# Patient Record
Sex: Female | Born: 1960
Health system: Southern US, Community
[De-identification: ages and names within clinical notes are randomized; demographics above are authoritative.]

## PROBLEM LIST (undated history)

## (undated) DIAGNOSIS — N951 Menopausal and female climacteric states: Secondary | ICD-10-CM

## (undated) DIAGNOSIS — F419 Anxiety disorder, unspecified: Secondary | ICD-10-CM

## (undated) HISTORY — PX: CERVIX SURGERY: SHX593

## (undated) HISTORY — PX: DILATION AND CURETTAGE OF UTERUS: SHX78

## (undated) HISTORY — DX: Anxiety disorder, unspecified: F41.9

## (undated) HISTORY — PX: COLONOSCOPY: SHX174

---

## 2002-10-17 HISTORY — PX: BREAST CYST ASPIRATION: SHX578

## 2010-10-05 ENCOUNTER — Ambulatory Visit: Payer: Self-pay | Admitting: Family Medicine

## 2012-03-07 ENCOUNTER — Ambulatory Visit: Payer: Self-pay | Admitting: Family Medicine

## 2013-03-14 ENCOUNTER — Ambulatory Visit: Payer: Self-pay | Admitting: Family Medicine

## 2014-03-06 LAB — CBC AND DIFFERENTIAL
HEMATOCRIT: 42 % (ref 36–46)
HEMOGLOBIN: 13.9 g/dL (ref 12.0–16.0)
NEUTROS ABS: 60 /uL
Platelets: 252 10*3/uL (ref 150–399)
WBC: 6.3 10^3/mL

## 2014-03-06 LAB — HM MAMMOGRAPHY: HM Mammogram: NEGATIVE

## 2014-03-06 LAB — HEPATIC FUNCTION PANEL
ALK PHOS: 92 U/L (ref 25–125)
ALT: 14 U/L (ref 7–35)
AST: 21 U/L (ref 13–35)
BILIRUBIN, TOTAL: 0.5 mg/dL

## 2014-03-06 LAB — LIPID PANEL
CHOLESTEROL: 269 mg/dL — AB (ref 0–200)
HDL: 53 mg/dL (ref 35–70)
LDL Cholesterol: 177 mg/dL
LDL/HDL RATIO: 3.3
TRIGLYCERIDES: 194 mg/dL — AB (ref 40–160)

## 2014-03-06 LAB — BASIC METABOLIC PANEL
BUN: 13 mg/dL (ref 4–21)
Creatinine: 0.7 mg/dL (ref 0.5–1.1)
GLUCOSE: 91 mg/dL
POTASSIUM: 4.7 mmol/L (ref 3.4–5.3)
SODIUM: 142 mmol/L (ref 137–147)

## 2014-03-06 LAB — TSH: TSH: 1.66 u[IU]/mL (ref 0.41–5.90)

## 2014-03-06 LAB — HM PAP SMEAR: HM Pap smear: NEGATIVE

## 2014-03-10 LAB — HM COLONOSCOPY

## 2014-04-09 ENCOUNTER — Ambulatory Visit: Payer: Self-pay | Admitting: Family Medicine

## 2014-05-06 ENCOUNTER — Ambulatory Visit: Payer: Self-pay | Admitting: Family Medicine

## 2015-02-11 DIAGNOSIS — N200 Calculus of kidney: Secondary | ICD-10-CM | POA: Insufficient documentation

## 2015-02-11 DIAGNOSIS — G43109 Migraine with aura, not intractable, without status migrainosus: Secondary | ICD-10-CM | POA: Insufficient documentation

## 2015-02-11 DIAGNOSIS — M25559 Pain in unspecified hip: Secondary | ICD-10-CM | POA: Insufficient documentation

## 2015-02-11 DIAGNOSIS — N951 Menopausal and female climacteric states: Secondary | ICD-10-CM | POA: Insufficient documentation

## 2015-02-11 DIAGNOSIS — E559 Vitamin D deficiency, unspecified: Secondary | ICD-10-CM | POA: Insufficient documentation

## 2015-02-11 DIAGNOSIS — M549 Dorsalgia, unspecified: Secondary | ICD-10-CM | POA: Insufficient documentation

## 2015-02-11 DIAGNOSIS — IMO0002 Reserved for concepts with insufficient information to code with codable children: Secondary | ICD-10-CM | POA: Insufficient documentation

## 2015-02-11 DIAGNOSIS — O9989 Other specified diseases and conditions complicating pregnancy, childbirth and the puerperium: Secondary | ICD-10-CM

## 2015-03-26 ENCOUNTER — Encounter: Payer: Self-pay | Admitting: Family Medicine

## 2015-03-26 ENCOUNTER — Ambulatory Visit (INDEPENDENT_AMBULATORY_CARE_PROVIDER_SITE_OTHER): Payer: 59 | Admitting: Family Medicine

## 2015-03-26 VITALS — BP 122/80 | HR 68 | Temp 97.9°F | Resp 12 | Ht 62.25 in | Wt 143.0 lb

## 2015-03-26 DIAGNOSIS — E559 Vitamin D deficiency, unspecified: Secondary | ICD-10-CM

## 2015-03-26 DIAGNOSIS — Z0000 Encounter for general adult medical examination without abnormal findings: Secondary | ICD-10-CM | POA: Diagnosis not present

## 2015-03-26 DIAGNOSIS — N951 Menopausal and female climacteric states: Secondary | ICD-10-CM | POA: Diagnosis not present

## 2015-03-26 LAB — POCT URINALYSIS DIPSTICK
Bilirubin, UA: NEGATIVE
Glucose, UA: NEGATIVE
KETONES UA: NEGATIVE
Leukocytes, UA: NEGATIVE
Nitrite, UA: NEGATIVE
PH UA: 6
Protein, UA: NEGATIVE
SPEC GRAV UA: 1.015
UROBILINOGEN UA: 0.2

## 2015-03-26 MED ORDER — VENLAFAXINE HCL 75 MG PO TABS: 75.0000 mg | ORAL_TABLET | Freq: Every day | ORAL | Status: DC

## 2015-03-26 MED ORDER — MEDROXYPROGESTERONE ACETATE 2.5 MG PO TABS: 2.5000 mg | ORAL_TABLET | Freq: Every day | ORAL | Status: DC

## 2015-03-26 MED ORDER — ESTRADIOL 0.5 MG PO TABS: 0.5000 mg | ORAL_TABLET | Freq: Every day | ORAL | Status: DC

## 2015-03-26 NOTE — Progress Notes (Deleted)
Patient ID: DYANI BABEL, female   DOB: 1961-10-11, 54 y.o.   MRN: [id number] Patient: [name], Female    DOB: [birth year], 54 y.o.   MRN: [id number] Visit Date: 03/26/2015  Today's Provider: Wilhemena Durie, MD   Chief Complaint  Patient presents with  . Medicare Wellness   Subjective:  Michelle Bradley is a 54 y.o. female who presents today for health maintenance and complete physical. She feels well. She reports exercising 3 to 4 days a week. She reports she is sleeping well. Pap smear was done in May 2015 with HPV and it was normal, never had hysterectomy. Had mammogram done in June 2015 and Colonoscopy in August 2015. Never had BMD.    Review of Systems  Constitutional: Negative.   HENT: Negative.   Eyes: Negative.   Respiratory: Negative.   Cardiovascular: Negative.   Gastrointestinal: Negative.   Endocrine: Negative.   Genitourinary: Negative.   Musculoskeletal: Negative.   Skin: Negative.   Allergic/Immunologic: Negative.   Neurological: Negative.   Hematological: Negative.   Psychiatric/Behavioral: Negative.     History   Social History  . Marital Status: Married    Spouse Name: Richardson Landry  . Number of Children: 2  . Years of Education: 15   Occupational History  . [employer] at [employer]    Social History Main Topics  . Smoking status: Never Smoker   . Smokeless tobacco: Never Used  . Alcohol Use: No  . Drug Use: No  . Sexual Activity: Yes    Birth Control/ Protection: None   Other Topics Concern  . Not on file   Social History Narrative  . No narrative on file    Patient Active Problem List   Diagnosis Date Noted  . Back ache 02/11/2015  . Conditions present during labor, delivery, or the puerperium 02/11/2015  . Migraine with aura 02/11/2015  . Hot flash, menopausal 02/11/2015  . Calculus of kidney 02/11/2015  . Menopausal symptom 02/11/2015  . Arthralgia of hip 02/11/2015  . Avitaminosis D 02/11/2015    Past Surgical History   Procedure Laterality Date  . Cesarean section  1985  . Dilation and curettage of uterus      x 2 after c section  . Cervix surgery      sawing up cervix internally    Her family history includes Bone cancer in her father and paternal uncle; Crohn's disease in her brother; Leukemia in her mother; Multiple myeloma in her father and paternal uncle; Multiple sclerosis in her brother.    Outpatient Prescriptions Prior to Visit  Medication Sig Dispense Refill  . estradiol (ESTRACE) 0.5 MG tablet Take 0.5 mg by mouth daily.    . Glucosamine-Chondroitin-Vit D3 1500-1200-800 MG-MG-UNIT PACK Take 1 tablet by mouth daily.    Marland Kitchen loratadine (CLARITIN) 10 MG tablet Take 1 tablet by mouth daily as needed.    . medroxyPROGESTERone (PROVERA) 2.5 MG tablet Take 2.5 mg by mouth daily.    . Multiple Vitamin (MULTIVITAMINS PO) Take 1 tablet by mouth daily.    Marland Kitchen venlafaxine (EFFEXOR) 75 MG tablet Take 75 mg by mouth daily.    Marland Kitchen HYDROcodone-acetaminophen (NORCO/VICODIN) 5-325 MG per tablet Take 1 tablet by mouth every 6 (six) hours as needed for moderate pain.    . promethazine (PHENERGAN) 25 MG tablet Take 25 mg by mouth every 6 (six) hours as needed for nausea or vomiting.     No facility-administered medications prior to visit.  Medication List       This list is accurate as of: 03/26/15  9:26 AM.  Always use your most recent med list.               estradiol 0.5 MG tablet  Commonly known as:  ESTRACE  Take 0.5 mg by mouth daily.     Glucosamine-Chondroitin-Vit D3 1500-1200-800 MG-MG-UNIT Pack  Take 1 tablet by mouth daily.     loratadine 10 MG tablet  Commonly known as:  CLARITIN  Take 1 tablet by mouth daily as needed.     medroxyPROGESTERone 2.5 MG tablet  Commonly known as:  PROVERA  Take 2.5 mg by mouth daily.     MULTIVITAMINS PO  Take 1 tablet by mouth daily.     venlafaxine 75 MG tablet  Commonly known as:  EFFEXOR  Take 75 mg by mouth daily.         Patient Care  Team: Jerrol Banana., MD as PCP - General (Family Medicine)     Objective:   Vitals:  Filed Vitals:   03/26/15 0923  BP: 122/80  Pulse: 68  Temp: 97.9 F (36.6 C)  Resp: 12  Height: 5' 2.25" (1.581 m)  Weight: 143 lb (64.864 kg)    Physical Exam   Depression Screen PHQ 2/9 Scores 03/26/2015  PHQ - 2 Score 0      Assessment & Plan:     Routine Health Maintenance and Physical Exam  Exercise Activities and Dietary recommendations   Immunization History  Administered Date(s) Administered  . Tdap 02/08/2012    Health Maintenance  Topic Date Due  . HIV Screening  12/05/1973  . INFLUENZA VACCINE  05/18/2015  . MAMMOGRAM  03/06/2016  . PAP SMEAR  03/06/2017  . TETANUS/TDAP  02/07/2022  . COLONOSCOPY  03/10/2024      Discussed health benefits of physical activity, and encouraged her to engage in regular exercise appropriate for her age and condition.    ------------------------------------------------------------------------------------------------------------

## 2015-03-26 NOTE — Progress Notes (Deleted)
Patient ID: TANEIKA CHOI, female   DOB: May 28, 1961, 54 y.o.   MRN: [id number] Patient: [name], Female    DOB: 07-09-1961, 54 y.o.   MRN: [id number] Visit Date: 03/26/2015  Today's Provider: Wilhemena Durie, MD   Chief Complaint  Patient presents with  . Medicare Wellness   Subjective:  EMERI ESTILL is a 54 y.o. female who presents today for health maintenance and complete physical. She feels {DESC; WELL/FAIRLY WELL/POORLY:18703}. She reports exercising ***. She reports she is sleeping {DESC; WELL/FAIRLY WELL/POORLY:18703}.   Review of Systems  History   Social History  . Marital Status: Married    Spouse Name: Richardson Landry  . Number of Children: 2  . Years of Education: 15   Occupational History  . [employer] at [employer]    Social History Main Topics  . Smoking status: Never Smoker   . Smokeless tobacco: Never Used  . Alcohol Use: No  . Drug Use: No  . Sexual Activity: Yes    Birth Control/ Protection: None   Other Topics Concern  . Not on file   Social History Narrative  . No narrative on file    Patient Active Problem List   Diagnosis Date Noted  . Back ache 02/11/2015  . Conditions present during labor, delivery, or the puerperium 02/11/2015  . Migraine with aura 02/11/2015  . Hot flash, menopausal 02/11/2015  . Calculus of kidney 02/11/2015  . Menopausal symptom 02/11/2015  . Arthralgia of hip 02/11/2015  . Avitaminosis D 02/11/2015    Past Surgical History  Procedure Laterality Date  . Cesarean section  1985  . Dilation and curettage of uterus      x 2 after c section  . Cervix surgery      sawing up cervix internally    Her family history includes Bone cancer in her father and paternal uncle; Crohn's disease in her brother; Leukemia in her mother; Multiple myeloma in her father and paternal uncle; Multiple sclerosis in her brother.      Patient Care Team: Jerrol Banana., MD as PCP - General (Family Medicine)      Objective:   Vitals: There were no vitals filed for this visit.  Physical Exam   Depression Screen PHQ 2/9 Scores 03/26/2015  PHQ - 2 Score 0      Assessment & Plan:     Routine Health Maintenance and Physical Exam  Exercise Activities and Dietary recommendations   Immunization History  Administered Date(s) Administered  . Tdap 02/08/2012    Health Maintenance  Topic Date Due  . HIV Screening  12/05/1973  . INFLUENZA VACCINE  05/18/2015  . MAMMOGRAM  03/06/2016  . PAP SMEAR  03/06/2017  . TETANUS/TDAP  02/07/2022  . COLONOSCOPY  03/10/2024      Discussed health benefits of physical activity, and encouraged her to engage in regular exercise appropriate for her age and condition.    ------------------------------------------------------------------------------------------------------------

## 2015-03-26 NOTE — Progress Notes (Signed)
Patient ID: Michelle Bradley, female   DOB: August 19, 1961, 54 y.o.   MRN: [id number] Patient: Michelle Bradley, Female    DOB: [birth year], 54 y.o.   MRN: [id number] Visit Date: 03/26/2015  Today's Provider: Wilhemena Durie, MD   Chief Complaint  Patient presents with  . Annual Exam   Subjective:  Michelle Bradley is a 54 y.o. female who presents today for health maintenance and complete physical. She feels well. She reports exercising 3 to 4 times a week. She reports she is sleeping well. Pap smear was in May 2015 with HPV results were normal. Mammogram up to date in June 2015, Colonoscopy August 2015 and never had BMD. 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 Review of Systems  Constitutional: Negative.   HENT: Negative.   Eyes: Negative.   Respiratory: Negative.   Cardiovascular: Negative.   Gastrointestinal: Negative.   Endocrine: Negative.   Genitourinary: Negative.   Musculoskeletal: Negative.   Skin: Negative.   Allergic/Immunologic: Negative.   Neurological: Negative.   Hematological: Negative.   Psychiatric/Behavioral: Negative.     History   Social History  . Marital Status: Married    Spouse Name: Richardson Landry  . Number of Children: 2  . Years of Education: 15   Occupational History  . [place] at [employer]    Social History Main Topics  . Smoking status: Never Smoker   . Smokeless tobacco: Never Used  . Alcohol Use: No  . Drug Use: No  . Sexual Activity: Yes    Birth Control/ Protection: None   Other Topics Concern  . Not on file   Social History Narrative  . No narrative on file    Patient Active Problem List   Diagnosis Date Noted  . Back ache 02/11/2015  . Conditions present during labor, delivery, or the puerperium 02/11/2015  . Migraine with aura 02/11/2015  . Hot flash, menopausal 02/11/2015  . Calculus of kidney 02/11/2015  . Menopausal symptom 02/11/2015   . Arthralgia of hip 02/11/2015  . Avitaminosis D 02/11/2015    Past Surgical History  Procedure Laterality Date  . Cesarean section  1985  . Dilation and curettage of uterus      x 2 after c section  . Cervix surgery      sawing up cervix internally    Her family history includes Bone cancer in her father and paternal uncle; Crohn's disease in her brother; Leukemia in her mother; Multiple myeloma in her father and paternal uncle; Multiple sclerosis in her brother.    Outpatient Prescriptions Prior to Visit  Medication Sig Dispense Refill  . estradiol (ESTRACE) 0.5 MG tablet Take 0.5 mg by mouth daily.    . Glucosamine-Chondroitin-Vit D3 1500-1200-800 MG-MG-UNIT PACK Take 1 tablet by mouth daily.    Marland Kitchen loratadine (CLARITIN) 10 MG tablet Take 1 tablet by mouth daily as needed.    . medroxyPROGESTERone (PROVERA) 2.5 MG tablet Take 2.5 mg by mouth daily.    . Multiple Vitamin (MULTIVITAMINS PO) Take 1 tablet by mouth daily.    Marland Kitchen venlafaxine (EFFEXOR) 75 MG tablet Take 75 mg by mouth daily.    Marland Kitchen HYDROcodone-acetaminophen (NORCO/VICODIN) 5-325 MG per tablet Take 1 tablet by mouth every 6 (six) hours as needed for moderate pain.    . promethazine (PHENERGAN) 25 MG tablet Take 25 mg by mouth every 6 (six) hours as needed for nausea or  vomiting.     No facility-administered medications prior to visit.     Medication List       This list is accurate as of: 03/26/15 10:09 AM.  Always use your most recent med list.               estradiol 0.5 MG tablet  Commonly known as:  ESTRACE  Take 0.5 mg by mouth daily.     Glucosamine-Chondroitin-Vit D3 1500-1200-800 MG-MG-UNIT Pack  Take 1 tablet by mouth daily.     loratadine 10 MG tablet  Commonly known as:  CLARITIN  Take 1 tablet by mouth daily as needed.     medroxyPROGESTERone 2.5 MG tablet  Commonly known as:  PROVERA  Take 2.5 mg by mouth daily.     MULTIVITAMINS PO  Take 1 tablet by mouth daily.     venlafaxine 75 MG tablet   Commonly known as:  EFFEXOR  Take 75 mg by mouth daily.         Patient Care Team: Jerrol Banana., MD as PCP - General (Family Medicine)     Objective:   Vitals:  Filed Vitals:   03/26/15 0923  BP: 122/80  Pulse: 68  Temp: 97.9 F (36.6 C)  Resp: 12  Height: 5' 2.25" (1.581 m)  Weight: 143 lb (64.864 kg)    Physical Exam  Constitutional: She appears well-developed and well-nourished.  HENT:  Head: Normocephalic.  Right Ear: External ear normal.  Eyes: Conjunctivae are normal. Pupils are equal, round, and reactive to light.  Neck: Normal range of motion.  Cardiovascular: Normal rate, regular rhythm, normal heart sounds and intact distal pulses.   No murmur heard. Pulmonary/Chest: Effort normal and breath sounds normal. She has no wheezes. She has no rales.  Abdominal: Soft. Bowel sounds are normal. There is no tenderness.  Genitourinary:  Defer today.  Musculoskeletal: Normal range of motion.  Neurological: She is alert.  Skin: Skin is warm and dry. No rash noted. No erythema.  Psychiatric: She has a normal mood and affect. Her behavior is normal. Judgment and thought content normal.     Depression Screen PHQ 2/9 Scores 03/26/2015  PHQ - 2 Score 0      Assessment & Plan:    Routine Health Maintenance and Physical Exam HM information is updated Exercise Activities and Dietary recommendations Repeat Pap smear in 2018. Yearly mammography. Colonoscopy was August 2015. Her weight is good. RTC 1 year for CPE  Immunization History  Administered Date(s) Administered  . Tdap 02/08/2012    Health Maintenance  Topic Date Due  . HIV Screening  12/05/1973  . INFLUENZA VACCINE  05/18/2015  . MAMMOGRAM  03/06/2016  . PAP SMEAR  03/06/2017  . TETANUS/TDAP  02/07/2022  . COLONOSCOPY  03/10/2024      Discussed health benefits of physical activity, and encouraged her to engage in regular exercise appropriate for her age and condition.   1. Annual physical  exam Up to date on everything at this time. Pending lab results.  - CBC with Differential/Platelet - Comprehensive metabolic panel - Lipid Panel With LDL/HDL Ratio - TSH - POCT Urinalysis Dipstick  2. Vitamin D deficiency Check levels-last check was in 2014.   - Vit D  25 hydroxy (rtn osteoporosis monitoring)  3. Hot flash, menopausal Stable. Refills provided. Discussed with patient about trying to take Effexor every other day and possibly coming off of it eventually. Will re access on the next visit.  - estradiol (ESTRACE)  0.5 MG tablet; Take 1 tablet (0.5 mg total) by mouth daily.  Dispense: 30 tablet; Refill: 12 - medroxyPROGESTERone (PROVERA) 2.5 MG tablet; Take 1 tablet (2.5 mg total) by mouth daily.  Dispense: 30 tablet; Refill: 12 - venlafaxine (EFFEXOR) 75 MG tablet; Take 1 tablet (75 mg total) by mouth daily.  Dispense: 30 tablet; Refill: 12    Patient was seen and examined by Dr. Eulas Post and note was scribed by Theressa Millard, RMA.

## 2015-03-27 LAB — LIPID PANEL WITH LDL/HDL RATIO
CHOLESTEROL TOTAL: 285 mg/dL — AB (ref 100–199)
HDL: 54 mg/dL (ref >39)
LDL Calculated: 201 mg/dL — ABNORMAL HIGH (ref 0–99)
LDl/HDL Ratio: 3.7 ratio units — ABNORMAL HIGH (ref 0.0–3.2)
Triglycerides: 152 mg/dL — ABNORMAL HIGH (ref 0–149)
VLDL Cholesterol Cal: 30 mg/dL (ref 5–40)

## 2015-03-27 LAB — COMPREHENSIVE METABOLIC PANEL
ALT: 29 IU/L (ref 0–32)
AST: 21 IU/L (ref 0–40)
Albumin/Globulin Ratio: 1.5 (ref 1.1–2.5)
Albumin: 4.7 g/dL (ref 3.5–5.5)
Alkaline Phosphatase: 96 IU/L (ref 39–117)
BUN/Creatinine Ratio: 16 (ref 9–23)
BUN: 10 mg/dL (ref 6–24)
Bilirubin Total: 0.4 mg/dL (ref 0.0–1.2)
CO2: 24 mmol/L (ref 18–29)
Calcium: 9.9 mg/dL (ref 8.7–10.2)
Chloride: 101 mmol/L (ref 97–108)
Creatinine, Ser: 0.62 mg/dL (ref 0.57–1.00)
GFR calc Af Amer: 117 mL/min/{1.73_m2} (ref >59)
GFR, EST NON AFRICAN AMERICAN: 101 mL/min/{1.73_m2} (ref >59)
GLOBULIN, TOTAL: 3.2 g/dL (ref 1.5–4.5)
Glucose: 98 mg/dL (ref 65–99)
Potassium: 5.2 mmol/L (ref 3.5–5.2)
Sodium: 142 mmol/L (ref 134–144)
Total Protein: 7.9 g/dL (ref 6.0–8.5)

## 2015-03-27 LAB — CBC WITH DIFFERENTIAL/PLATELET
BASOS: 1 %
Basophils Absolute: 0.1 10*3/uL (ref 0.0–0.2)
EOS (ABSOLUTE): 0.1 10*3/uL (ref 0.0–0.4)
Eos: 1 %
HEMATOCRIT: 41.3 % (ref 34.0–46.6)
HEMOGLOBIN: 14 g/dL (ref 11.1–15.9)
Immature Grans (Abs): 0 10*3/uL (ref 0.0–0.1)
Immature Granulocytes: 0 %
LYMPHS ABS: 2.7 10*3/uL (ref 0.7–3.1)
LYMPHS: 32 %
MCH: 29.4 pg (ref 26.6–33.0)
MCHC: 33.9 g/dL (ref 31.5–35.7)
MCV: 87 fL (ref 79–97)
Monocytes Absolute: 0.5 10*3/uL (ref 0.1–0.9)
Monocytes: 6 %
Neutrophils Absolute: 5.1 10*3/uL (ref 1.4–7.0)
Neutrophils: 60 %
PLATELETS: 284 10*3/uL (ref 150–379)
RBC: 4.77 x10E6/uL (ref 3.77–5.28)
RDW: 12.8 % (ref 12.3–15.4)
WBC: 8.5 10*3/uL (ref 3.4–10.8)

## 2015-03-27 LAB — VITAMIN D 25 HYDROXY (VIT D DEFICIENCY, FRACTURES): Vit D, 25-Hydroxy: 58.3 ng/mL (ref 30.0–100.0)

## 2015-03-27 LAB — TSH: TSH: 1.77 u[IU]/mL (ref 0.450–4.500)

## 2015-03-31 ENCOUNTER — Telehealth: Payer: Self-pay

## 2015-03-31 NOTE — Telephone Encounter (Signed)
[facility] ED 

## 2015-03-31 NOTE — Telephone Encounter (Signed)
-----   Message from Jerrol Banana., MD sent at 03/29/2015  3:44 PM EDT ----- Labs stable. Please advise patient. diete and exercise with low fat/low carb diet and 20-30 minute aerobic exercise 4-6 days per week. Cholesterol higher than last year May need to treat in 6 months if not lower .

## 2015-04-15 ENCOUNTER — Telehealth: Payer: Self-pay

## 2015-04-15 MED ORDER — VENLAFAXINE HCL ER 75 MG PO CP24: 75.0000 mg | ORAL_CAPSULE | Freq: Every day | ORAL | Status: DC

## 2015-04-15 NOTE — Telephone Encounter (Signed)
Patient called and states Effexor that was sent in was regular tablets and she takes ER capsules. This has been updated in the chart and the right one was sent in-aa

## 2015-05-08 ENCOUNTER — Other Ambulatory Visit: Payer: Self-pay | Admitting: Family Medicine

## 2015-05-08 DIAGNOSIS — Z1231 Encounter for screening mammogram for malignant neoplasm of breast: Secondary | ICD-10-CM

## 2015-05-12 ENCOUNTER — Ambulatory Visit
Admission: RE | Admit: 2015-05-12 | Discharge: 2015-05-12 | Disposition: A | Discharge status: Home / Self Care | Payer: 59 | Source: Ambulatory Visit | Attending: Family Medicine | Admitting: Family Medicine

## 2015-05-12 DIAGNOSIS — Z1231 Encounter for screening mammogram for malignant neoplasm of breast: Secondary | ICD-10-CM | POA: Insufficient documentation

## 2015-07-10 ENCOUNTER — Encounter: Payer: Self-pay | Admitting: Physician Assistant

## 2015-07-10 ENCOUNTER — Ambulatory Visit (INDEPENDENT_AMBULATORY_CARE_PROVIDER_SITE_OTHER): Payer: 59 | Admitting: Physician Assistant

## 2015-07-10 VITALS — BP 102/76 | HR 86 | Temp 98.0°F | Resp 16 | Ht 62.0 in | Wt 142.4 lb

## 2015-07-10 DIAGNOSIS — H66002 Acute suppurative otitis media without spontaneous rupture of ear drum, left ear: Secondary | ICD-10-CM | POA: Diagnosis not present

## 2015-07-10 DIAGNOSIS — J0100 Acute maxillary sinusitis, unspecified: Secondary | ICD-10-CM

## 2015-07-10 MED ORDER — AMOXICILLIN 500 MG PO CAPS: 500.0000 mg | ORAL_CAPSULE | Freq: Three times a day (TID) | ORAL | Status: DC

## 2015-07-10 NOTE — Progress Notes (Signed)
Subjective:    Patient ID: Michelle Bradley, female    DOB: [birth year], 54 y.o.   MRN: [id number]  HPI Patient comes in office today with concerns of ear pain since Sunday, patient describes noise in ear as "humming and popping". Patient reports decreased hearing mostly in her left ear than right, patient has complaints of dizziness which she describes as feeling "off balance" and nausea. This only happens about once per day. Patient has tried taking otc Sudafed with no relief.    Review of Systems  Constitutional: Negative for fever, chills and fatigue.  HENT: Positive for congestion, ear pain, sinus pressure and tinnitus. Negative for ear discharge, postnasal drip, rhinorrhea, sneezing, sore throat, trouble swallowing and voice change.   Eyes: Negative.   Respiratory: Negative for cough, chest tightness, shortness of breath and wheezing.   Cardiovascular: Negative for chest pain and palpitations.  Gastrointestinal: Positive for nausea (occasionally).  Musculoskeletal: Negative for neck pain and neck stiffness.  Neurological: Positive for dizziness (occasionally). Negative for syncope, weakness, light-headedness and headaches.       Objective:   Physical Exam  Constitutional: She appears well-developed and well-nourished. No distress.  HENT:  Head: Normocephalic and atraumatic.  Right Ear: Hearing, external ear and ear canal normal. Tympanic membrane is bulging. Tympanic membrane is not erythematous. A middle ear effusion is present.  Left Ear: Hearing, external ear and ear canal normal. Tympanic membrane is erythematous. Tympanic membrane is not bulging. A middle ear effusion is present.  Nose: Right sinus exhibits maxillary sinus tenderness. Right sinus exhibits no frontal sinus tenderness. Left sinus exhibits maxillary sinus tenderness. Left sinus exhibits no frontal sinus tenderness.  Mouth/Throat: Uvula is midline, oropharynx is clear and moist and mucous membranes are normal. No  oropharyngeal exudate.  Eyes: Conjunctivae are normal. Pupils are equal, round, and reactive to light. Right eye exhibits no discharge. Left eye exhibits no discharge. No scleral icterus.  Neck: Normal range of motion. Neck supple. No tracheal deviation present. No thyromegaly present.  Cardiovascular: Normal rate, regular rhythm and normal heart sounds.  Exam reveals no gallop and no friction rub.   No murmur heard. Pulmonary/Chest: Effort normal and breath sounds normal. No stridor. No respiratory distress. She has no wheezes. She has no rales.  Lymphadenopathy:    She has no cervical adenopathy.  Skin: Skin is warm and dry. She is not diaphoretic.  Vitals reviewed.         Assessment & Plan:  1. Acute suppurative otitis media of left ear without spontaneous rupture of tympanic membrane, recurrence not specified Worsening. I will give amoxicillin as below. I did advise her to continue with the Sudafed as she has been doing for a decongestant due to the fluid behind the tympanic membrane. She is to call the office if her symptoms persist or worsen. - amoxicillin (AMOXIL) 500 MG capsule; Take 1 capsule (500 mg total) by mouth 3 (three) times daily.  Dispense: 30 capsule; Refill: 0  2. Acute maxillary sinusitis, recurrence not specified Worsening. She states that she does get sinus infections with the season changes most often spring and fall. She is not currently taking any allergy medication. She has been taking Sudafed without much relief. She has been seen by ENT previously as well. Last time she had a sinus infection she was given Augmentin which caused pretty severe GI upset that cause her to discontinue taking it. I will give amoxicillin as below for the sinus infection but more so for the  ear infection. She is to call the office if symptoms persist or worsen. - amoxicillin (AMOXIL) 500 MG capsule; Take 1 capsule (500 mg total) by mouth 3 (three) times daily.  Dispense: 30 capsule;  Refill: 0

## 2015-07-10 NOTE — Patient Instructions (Signed)
Sinusitis Sinusitis is redness, soreness, and inflammation of the paranasal sinuses. Paranasal sinuses are air pockets within the bones of your face (beneath the eyes, the middle of the forehead, or above the eyes). In healthy paranasal sinuses, mucus is able to drain out, and air is able to circulate through them by way of your nose. However, when your paranasal sinuses are inflamed, mucus and air can become trapped. This can allow bacteria and other germs to grow and cause infection. Sinusitis can develop quickly and last only a short time (acute) or continue over a long period (chronic). Sinusitis that lasts for more than 12 weeks is considered chronic.  CAUSES  Causes of sinusitis include:  Allergies.  Structural abnormalities, such as displacement of the cartilage that separates your nostrils (deviated septum), which can decrease the air flow through your nose and sinuses and affect sinus drainage.  Functional abnormalities, such as when the small hairs (cilia) that line your sinuses and help remove mucus do not work properly or are not present. SIGNS AND SYMPTOMS  Symptoms of acute and chronic sinusitis are the same. The primary symptoms are pain and pressure around the affected sinuses. Other symptoms include:  Upper toothache.  Earache.  Headache.  Bad breath.  Decreased sense of smell and taste.  A cough, which worsens when you are lying flat.  Fatigue.  Fever.  Thick drainage from your nose, which often is green and may contain pus (purulent).  Swelling and warmth over the affected sinuses. DIAGNOSIS  Your health care provider will perform a physical exam. During the exam, your health care provider may:  Look in your nose for signs of abnormal growths in your nostrils (nasal polyps).  Tap over the affected sinus to check for signs of infection.  View the inside of your sinuses (endoscopy) using an imaging device that has a light attached (endoscope). If your health  care provider suspects that you have chronic sinusitis, one or more of the following tests may be recommended:  Allergy tests.  Nasal culture. A sample of mucus is taken from your nose, sent to a lab, and screened for bacteria.  Nasal cytology. A sample of mucus is taken from your nose and examined by your health care provider to determine if your sinusitis is related to an allergy. TREATMENT  Most cases of acute sinusitis are related to a viral infection and will resolve on their own within 10 days. Sometimes medicines are prescribed to help relieve symptoms (pain medicine, decongestants, nasal steroid sprays, or saline sprays).  However, for sinusitis related to a bacterial infection, your health care provider will prescribe antibiotic medicines. These are medicines that will help kill the bacteria causing the infection.  Rarely, sinusitis is caused by a fungal infection. In theses cases, your health care provider will prescribe antifungal medicine. For some cases of chronic sinusitis, surgery is needed. Generally, these are cases in which sinusitis recurs more than 3 times per year, despite other treatments. HOME CARE INSTRUCTIONS   Drink plenty of water. Water helps thin the mucus so your sinuses can drain more easily.  Use a humidifier.  Inhale steam 3 to 4 times a day (for example, sit in the bathroom with the shower running).  Apply a warm, moist washcloth to your face 3 to 4 times a day, or as directed by your health care provider.  Use saline nasal sprays to help moisten and clean your sinuses.  Take medicines only as directed by your health care provider.    If you were prescribed either an antibiotic or antifungal medicine, finish it all even if you start to feel better. °SEEK IMMEDIATE MEDICAL CARE IF: °· You have increasing pain or severe headaches. °· You have nausea, vomiting, or drowsiness. °· You have swelling around your face. °· You have vision problems. °· You have a stiff  neck. °· You have difficulty breathing. °MAKE SURE YOU:  °· Understand these instructions. °· Will watch your condition. °· Will get help right away if you are not doing well or get worse. °Document Released: 10/03/2005 Document Revised: 02/17/2014 Document Reviewed: 10/18/2011 °Exit[employer]® Patient Information ©2015 [employer]. This information is not intended to replace advice given to you by your health care provider. Make sure you discuss any questions you have with your health care provider. °Otitis Media With Effusion °Otitis media with effusion is the presence of fluid in the middle ear. This is a common problem in children, which often follows ear infections. It may be present for weeks or longer after the infection. Unlike an acute ear infection, otitis media with effusion refers only to fluid behind the ear drum and not infection. Children with repeated ear and sinus infections and allergy problems are the most likely to get otitis media with effusion. °CAUSES  °The most frequent cause of the fluid buildup is dysfunction of the eustachian tubes. These are the tubes that drain fluid in the ears to the back of the nose (nasopharynx). °SYMPTOMS  °· The main symptom of this condition is hearing loss. As a result, you or your child may: °· Listen to the TV at a loud volume. °· Not respond to questions. °· Ask "what" often when spoken to. °· Mistake or confuse one sound or word for another. °· There may be a sensation of fullness or pressure but usually not pain. °DIAGNOSIS  °· Your health care provider will diagnose this condition by examining you or your child's ears. °· Your health care provider may test the pressure in you or your child's ear with a tympanometer. °· A hearing test may be conducted if the problem persists. °TREATMENT  °· Treatment depends on the duration and the effects of the effusion. °· Antibiotics, decongestants, nose drops, and cortisone-type drugs (tablets or nasal spray) may not be  helpful. °· Children with persistent ear effusions may have delayed language or behavioral problems. Children at risk for developmental delays in hearing, learning, and speech may require referral to a specialist earlier than children not at risk. °· You or your child's health care provider may suggest a referral to an ear, nose, and throat surgeon for treatment. The following may help restore normal hearing: °· Drainage of fluid. °· Placement of ear tubes (tympanostomy tubes). °· Removal of adenoids (adenoidectomy). °HOME CARE INSTRUCTIONS  °· Avoid secondhand smoke. °· Infants who are breastfed are less likely to have this condition. °· Avoid feeding infants while they are lying flat. °· Avoid known environmental allergens. °· Avoid people who are sick. °SEEK MEDICAL CARE IF:  °· Hearing is not better in 3 months. °· Hearing is worse. °· Ear pain. °· Drainage from the ear. °· Dizziness. °MAKE SURE YOU:  °· Understand these instructions. °· Will watch your condition. °· Will get help right away if you are not doing well or get worse. °Document Released: 11/10/2004 Document Revised: 02/17/2014 Document Reviewed: 04/30/2013 °ExitCare® Patient Information ©2015 [employer]. This information is not intended to replace advice given to you by your health care provider. Make sure   you discuss any questions you have with your health care provider.

## 2015-07-27 ENCOUNTER — Ambulatory Visit (INDEPENDENT_AMBULATORY_CARE_PROVIDER_SITE_OTHER): Payer: 59 | Admitting: Family Medicine

## 2015-07-27 ENCOUNTER — Encounter: Payer: Self-pay | Admitting: Family Medicine

## 2015-07-27 VITALS — BP 122/78 | HR 76 | Temp 98.3°F | Resp 16 | Wt 143.8 lb

## 2015-07-27 DIAGNOSIS — J320 Chronic maxillary sinusitis: Secondary | ICD-10-CM

## 2015-07-27 DIAGNOSIS — H6590 Unspecified nonsuppurative otitis media, unspecified ear: Secondary | ICD-10-CM | POA: Diagnosis not present

## 2015-07-27 MED ORDER — AMOXICILLIN-POT CLAVULANATE 875-125 MG PO TABS
1.0000 | ORAL_TABLET | Freq: Two times a day (BID) | ORAL | Status: DC
Start: 2015-07-27 — End: 2016-03-30

## 2015-07-27 NOTE — Progress Notes (Signed)
Patient ID: JOYCELYNN FRITSCHE, female   DOB: September 07, 1961, 54 y.o.   MRN: [id number] Name: Michelle Bradley   MRN: [id number]    DOB: [birth year]   Date:07/27/2015       Progress Note  Subjective  Chief Complaint  Chief Complaint  Patient presents with  . Ear Pain    Left ear X 3 days   Otalgia  There is pain in the left ear. This is a recurrent problem. The current episode started in the past 7 days (return of symptoms 3 days ago). The problem occurs constantly (stopped up sensation with an echo). There has been no fever. Associated symptoms include coughing. Pertinent negatives include no rhinorrhea or sore throat. Associated symptoms comments: Some dizziness over the weekend but none today. Having post nasal drip causing cough in the mornings only.. She has tried antibiotics and acetaminophen for the symptoms.   Patient Active Problem List   Diagnosis Date Noted  . Migraine with aura 02/11/2015  . Hot flash, menopausal 02/11/2015  . Calculus of kidney 02/11/2015  . Menopausal symptom 02/11/2015  . Avitaminosis D 02/11/2015   Past Surgical History  Procedure Laterality Date  . Cesarean section  1985  . Dilation and curettage of uterus      x 2 after c section  . Cervix surgery      sawing up cervix internally  . Breast cyst aspiration Left 2004   Family History  Problem Relation Age of Onset  . Leukemia Mother   . Bone cancer Father   . Multiple myeloma Father   . Multiple sclerosis Brother   . Bone cancer Paternal Uncle   . Multiple myeloma Paternal Uncle   . Crohn's disease Brother    Social History  Substance Use Topics  . Smoking status: Never Smoker   . Smokeless tobacco: Never Used  . Alcohol Use: No    Current outpatient prescriptions:  .  estradiol (ESTRACE) 0.5 MG tablet, Take 1 tablet (0.5 mg total) by mouth daily., Disp: 30 tablet, Rfl: 12 .  Glucosamine-Chondroitin-Vit D3 1500-1200-800 MG-MG-UNIT PACK, Take 1 tablet by mouth daily., Disp: , Rfl:  .   medroxyPROGESTERone (PROVERA) 2.5 MG tablet, Take 1 tablet (2.5 mg total) by mouth daily., Disp: 30 tablet, Rfl: 12 .  Multiple Vitamin (MULTIVITAMINS PO), Take 1 tablet by mouth daily., Disp: , Rfl:  .  venlafaxine XR (EFFEXOR-XR) 75 MG 24 hr capsule, Take 1 capsule (75 mg total) by mouth daily with breakfast., Disp: 30 capsule, Rfl: 12  No Known Allergies  Review of Systems  Constitutional: Negative.   HENT: Positive for ear pain. Negative for rhinorrhea and sore throat.   Eyes: Negative.   Respiratory: Positive for cough.   Cardiovascular: Negative.   Gastrointestinal: Negative.   Genitourinary: Negative.   Musculoskeletal: Negative.   Skin: Negative.   Neurological: Negative.   Endo/Heme/Allergies: Negative.   Psychiatric/Behavioral: Negative.    Objective  Filed Vitals:   07/27/15 0857  BP: 122/78  Pulse: 76  Temp: 98.3 F (36.8 C)  TempSrc: Oral  Resp: 16  Weight: 143 lb 12.8 oz (65.227 kg)  SpO2: 96%   Physical Exam  Constitutional: She is well-developed, well-nourished, and in no distress.  HENT:  Head: Normocephalic and atraumatic.  Right Ear: Tympanic membrane is retracted. A middle ear effusion is present.  Left Ear: Tympanic membrane is retracted. A middle ear effusion is present.  Nose: Right sinus exhibits no maxillary sinus tenderness. Left sinus exhibits maxillary sinus tenderness.  Mouth/Throat: Uvula is midline, oropharynx is clear and moist and mucous membranes are normal.  Eyes: Conjunctivae and EOM are normal.  Neck: Normal range of motion. Neck supple.  Cardiovascular: Normal rate and regular rhythm.   Pulmonary/Chest: Effort normal and breath sounds normal.  Abdominal: Soft. Bowel sounds are normal.  Lymphadenopathy:    She has no cervical adenopathy.   Assessment & Plan 1. Serous otitis media, recurrence not specified, unspecified chronicity, unspecified laterality Persistent stopped up sensation in ears. No redness but some retraction of  TM's now. Recommend nasal steroid spray at bedtime and may add Claritin. Recheck in 10 days if no better. May need ENT referral.  2. Left maxillary sinusitis No transillumination of the left maxillary sinus with mild soreness. Will treat with Augmentin and may use Netti-Pot irrigation. Recheck in 10 days if no better.  - amoxicillin-clavulanate (AUGMENTIN) 875-125 MG tablet; Take 1 tablet by mouth 2 (two) times daily.  Dispense: 20 tablet; Refill: 0

## 2015-10-23 ENCOUNTER — Other Ambulatory Visit: Payer: Self-pay

## 2016-03-14 ENCOUNTER — Other Ambulatory Visit: Payer: Self-pay | Admitting: Family Medicine

## 2016-03-24 ENCOUNTER — Encounter: Payer: 59 | Admitting: Family Medicine

## 2016-03-30 ENCOUNTER — Ambulatory Visit (INDEPENDENT_AMBULATORY_CARE_PROVIDER_SITE_OTHER): Payer: 59 | Admitting: Family Medicine

## 2016-03-30 ENCOUNTER — Encounter: Payer: Self-pay | Admitting: Family Medicine

## 2016-03-30 VITALS — BP 114/80 | HR 84 | Temp 97.9°F | Resp 14 | Ht 62.75 in | Wt 149.0 lb

## 2016-03-30 DIAGNOSIS — N951 Menopausal and female climacteric states: Secondary | ICD-10-CM

## 2016-03-30 DIAGNOSIS — Z0000 Encounter for general adult medical examination without abnormal findings: Secondary | ICD-10-CM

## 2016-03-30 DIAGNOSIS — Z1239 Encounter for other screening for malignant neoplasm of breast: Secondary | ICD-10-CM

## 2016-03-30 LAB — POCT URINALYSIS DIPSTICK
Bilirubin, UA: NEGATIVE
GLUCOSE UA: NEGATIVE
Ketones, UA: NEGATIVE
Leukocytes, UA: NEGATIVE
Nitrite, UA: NEGATIVE
Protein, UA: NEGATIVE
Spec Grav, UA: 1.02
Urobilinogen, UA: 0.2
pH, UA: 6.5

## 2016-03-30 NOTE — Progress Notes (Signed)
Patient ID: Michelle Bradley, female   DOB: [birth year], 55 y.o.   MRN: [id number]  Visit Date: 03/30/2016  Today's Provider: Wilhemena Durie, MD   Chief Complaint  Patient presents with  . Annual Exam   Subjective:  Michelle Bradley is a 55 y.o. female who presents today for health maintenance and complete physical. She feels well. She reports exercising yes almost daily walking and goes to the gym. She reports she is sleeping well. She is slowly gasining weight and is ready to work on diet and exercise habits. Overall she feels very well. Last Colonoscopy 03/10/14 internal hemorrhoids, repeat in 10 years  Pap smear with HPV 03/06/14 normal-never had hysterectomy.  Mammogram 05/12/15 normal  Review of Systems  Constitutional: Negative.   HENT: Negative.   Eyes: Negative.   Respiratory: Negative.   Cardiovascular: Negative.   Gastrointestinal: Negative.   Endocrine: Negative.   Genitourinary: Negative.   Musculoskeletal: Negative.   Skin: Negative.   Allergic/Immunologic: Negative.   Neurological: Negative.   Hematological: Negative.   Psychiatric/Behavioral: Negative.     Social History   Social History  . Marital Status: Married    Spouse Name: Richardson Landry  . Number of Children: 2  . Years of Education: 15   Occupational History  . [employer] at [employer]    Social History Main Topics  . Smoking status: Never Smoker   . Smokeless tobacco: Never Used  . Alcohol Use: No  . Drug Use: No  . Sexual Activity: Yes    Birth Control/ Protection: None   Other Topics Concern  . Not on file   Social History Narrative    Patient Active Problem List   Diagnosis Date Noted  . Serous otitis media 07/27/2015  . Migraine with aura 02/11/2015  . Hot flash, menopausal 02/11/2015  . Calculus of kidney 02/11/2015  . Menopausal symptom 02/11/2015  . Avitaminosis D 02/11/2015    Past Surgical History  Procedure Laterality Date  . Cesarean section  1985  . Dilation and  curettage of uterus      x 2 after c section  . Cervix surgery      sawing up cervix internally  . Breast cyst aspiration Left 2004    Her family history includes Bone cancer in her father and paternal uncle; Crohn's disease in her brother; Leukemia in her mother; Multiple myeloma in her father and paternal uncle; Multiple sclerosis in her brother.    Outpatient Prescriptions Prior to Visit  Medication Sig Dispense Refill  . estradiol (ESTRACE) 0.5 MG tablet TAKE 1 TABLET BY MOUTH DAILY. 30 tablet 12  . medroxyPROGESTERone (PROVERA) 2.5 MG tablet TAKE 1 TABLET BY MOUTH DAILY. 30 tablet 0  . Multiple Vitamin (MULTIVITAMINS PO) Take 1 tablet by mouth daily.    Marland Kitchen venlafaxine XR (EFFEXOR-XR) 75 MG 24 hr capsule Take 1 capsule (75 mg total) by mouth daily with breakfast. 30 capsule 12  . amoxicillin-clavulanate (AUGMENTIN) 875-125 MG tablet Take 1 tablet by mouth 2 (two) times daily. 20 tablet 0  . Glucosamine-Chondroitin-Vit D3 1500-1200-800 MG-MG-UNIT PACK Take 1 tablet by mouth daily.     No facility-administered medications prior to visit.    Patient Care Team: Jerrol Banana., MD as PCP - General (Family Medicine)     Objective:   Vitals:  Filed Vitals:   03/30/16 0914  BP: 114/80  Pulse: 84  Temp: 97.9 F (36.6 C)  Resp: 14  Height: 5' 2.75" (1.594 m)  Weight:  149 lb (67.586 kg)    Physical Exam  Constitutional: She is oriented to person, place, and time. She appears well-developed and well-nourished.  HENT:  Head: Normocephalic and atraumatic.  Right Ear: External ear normal.  Left Ear: External ear normal.  Eyes: Conjunctivae are normal. Pupils are equal, round, and reactive to light.  Neck: Normal range of motion. Neck supple.  Cardiovascular: Normal rate, regular rhythm, normal heart sounds and intact distal pulses.   No murmur heard. Pulmonary/Chest: Effort normal and breath sounds normal. No respiratory distress. She has no wheezes.  Abdominal: Soft. She  exhibits no distension. There is no tenderness.  Musculoskeletal: She exhibits no edema or tenderness.  Neurological: She is alert and oriented to person, place, and time.  Skin: No rash noted. No erythema.  Psychiatric: She has a normal mood and affect. Her behavior is normal. Judgment and thought content normal.     Depression Screen PHQ 2/9 Scores 03/30/2016 03/26/2015  PHQ - 2 Score 0 0      Assessment & Plan:   1. Annual physical exam Defer DRE and pap smear this year, will get this done in 2018. - CBC with Differential/Platelet - Comprehensive metabolic panel - Lipid Panel With LDL/HDL Ratio - POCT Urinalysis Dipstick - TSH Overall good health.Pt to restart regular exercise and watch ice cream and also portion control. 2. Breast cancer screening Patient will call [facility] and get this set up.  3. Hot flash, menopausal  .stable  I have done the exam and reviewed the above chart and it is accurate to the best of my knowledge.

## 2016-03-31 LAB — LIPID PANEL WITH LDL/HDL RATIO
Cholesterol, Total: 252 mg/dL — ABNORMAL HIGH (ref 100–199)
HDL: 48 mg/dL (ref >39)
LDL Calculated: 162 mg/dL — ABNORMAL HIGH (ref 0–99)
LDL/HDL RATIO: 3.4 ratio — AB (ref 0.0–3.2)
Triglycerides: 210 mg/dL — ABNORMAL HIGH (ref 0–149)
VLDL CHOLESTEROL CAL: 42 mg/dL — AB (ref 5–40)

## 2016-03-31 LAB — CBC WITH DIFFERENTIAL/PLATELET
BASOS ABS: 0 10*3/uL (ref 0.0–0.2)
BASOS: 1 %
EOS (ABSOLUTE): 0.1 10*3/uL (ref 0.0–0.4)
Eos: 2 %
Hematocrit: 41.4 % (ref 34.0–46.6)
Hemoglobin: 14 g/dL (ref 11.1–15.9)
Immature Grans (Abs): 0 10*3/uL (ref 0.0–0.1)
Immature Granulocytes: 0 %
LYMPHS ABS: 2 10*3/uL (ref 0.7–3.1)
LYMPHS: 31 %
MCH: 30 pg (ref 26.6–33.0)
MCHC: 33.8 g/dL (ref 31.5–35.7)
MCV: 89 fL (ref 79–97)
MONOS ABS: 0.4 10*3/uL (ref 0.1–0.9)
Monocytes: 6 %
NEUTROS ABS: 3.8 10*3/uL (ref 1.4–7.0)
Neutrophils: 60 %
PLATELETS: 265 10*3/uL (ref 150–379)
RBC: 4.67 x10E6/uL (ref 3.77–5.28)
RDW: 13.4 % (ref 12.3–15.4)
WBC: 6.4 10*3/uL (ref 3.4–10.8)

## 2016-03-31 LAB — COMPREHENSIVE METABOLIC PANEL
A/G RATIO: 1.6 (ref 1.2–2.2)
ALT: 26 IU/L (ref 0–32)
AST: 20 IU/L (ref 0–40)
Albumin: 4.5 g/dL (ref 3.5–5.5)
Alkaline Phosphatase: 82 IU/L (ref 39–117)
BUN / CREAT RATIO: 19 (ref 9–23)
BUN: 12 mg/dL (ref 6–24)
Bilirubin Total: 0.3 mg/dL (ref 0.0–1.2)
CO2: 23 mmol/L (ref 18–29)
Calcium: 9.3 mg/dL (ref 8.7–10.2)
Chloride: 101 mmol/L (ref 96–106)
Creatinine, Ser: 0.62 mg/dL (ref 0.57–1.00)
GFR, EST AFRICAN AMERICAN: 116 mL/min/{1.73_m2} (ref >59)
GFR, EST NON AFRICAN AMERICAN: 100 mL/min/{1.73_m2} (ref >59)
GLUCOSE: 91 mg/dL (ref 65–99)
Globulin, Total: 2.9 g/dL (ref 1.5–4.5)
POTASSIUM: 4.8 mmol/L (ref 3.5–5.2)
Sodium: 141 mmol/L (ref 134–144)
Total Protein: 7.4 g/dL (ref 6.0–8.5)

## 2016-03-31 LAB — TSH: TSH: 1.31 u[IU]/mL (ref 0.450–4.500)

## 2016-04-15 ENCOUNTER — Other Ambulatory Visit: Payer: Self-pay | Admitting: Family Medicine

## 2016-07-05 ENCOUNTER — Other Ambulatory Visit: Payer: Self-pay | Admitting: Family Medicine

## 2016-07-05 ENCOUNTER — Ambulatory Visit
Admission: RE | Admit: 2016-07-05 | Discharge: 2016-07-05 | Disposition: A | Discharge status: Home / Self Care | Payer: 59 | Source: Ambulatory Visit | Attending: Family Medicine | Admitting: Family Medicine

## 2016-07-05 DIAGNOSIS — Z1231 Encounter for screening mammogram for malignant neoplasm of breast: Secondary | ICD-10-CM | POA: Diagnosis not present

## 2016-07-05 DIAGNOSIS — Z1239 Encounter for other screening for malignant neoplasm of breast: Secondary | ICD-10-CM

## 2016-10-24 DIAGNOSIS — Z23 Encounter for immunization: Secondary | ICD-10-CM | POA: Diagnosis not present

## 2017-03-20 ENCOUNTER — Other Ambulatory Visit: Payer: Self-pay | Admitting: Family Medicine

## 2017-03-20 MED ORDER — ESTRADIOL 0.5 MG PO TABS: 0.5000 mg | ORAL_TABLET | Freq: Every day | ORAL | 12 refills | Status: DC

## 2017-03-20 NOTE — Telephone Encounter (Signed)
[facility] faxed a refill request on the following medications:  estradiol (ESTRACE) 0.5 MG tablet.  Take 1 tablet by mouth every day.  [facility]  [place]

## 2017-03-20 NOTE — Telephone Encounter (Signed)
Sent!

## 2017-04-04 ENCOUNTER — Encounter: Payer: Self-pay | Admitting: Family Medicine

## 2017-04-04 ENCOUNTER — Ambulatory Visit (INDEPENDENT_AMBULATORY_CARE_PROVIDER_SITE_OTHER): Payer: BLUE CROSS/BLUE SHIELD | Admitting: Family Medicine

## 2017-04-04 VITALS — BP 116/62 | HR 88 | Temp 97.7°F | Resp 16 | Ht 62.0 in | Wt 146.0 lb

## 2017-04-04 DIAGNOSIS — R3129 Other microscopic hematuria: Secondary | ICD-10-CM

## 2017-04-04 DIAGNOSIS — Z124 Encounter for screening for malignant neoplasm of cervix: Secondary | ICD-10-CM

## 2017-04-04 DIAGNOSIS — Z0000 Encounter for general adult medical examination without abnormal findings: Secondary | ICD-10-CM

## 2017-04-04 DIAGNOSIS — Z79890 Hormone replacement therapy (postmenopausal): Secondary | ICD-10-CM | POA: Insufficient documentation

## 2017-04-04 DIAGNOSIS — N951 Menopausal and female climacteric states: Secondary | ICD-10-CM | POA: Diagnosis not present

## 2017-04-04 DIAGNOSIS — Z1211 Encounter for screening for malignant neoplasm of colon: Secondary | ICD-10-CM

## 2017-04-04 LAB — POCT UA - MICROSCOPIC ONLY
Bacteria, U Microscopic: NEGATIVE
Casts, Ur, LPF, POC: NEGATIVE
Crystals, Ur, HPF, POC: NEGATIVE
EPITHELIAL CELLS, URINE PER MICROSCOPY: NEGATIVE
Mucus, UA: NEGATIVE
RBC, urine, microscopic: NEGATIVE
WBC, UR, HPF, POC: NEGATIVE
YEAST UA: NEGATIVE

## 2017-04-04 LAB — POCT URINALYSIS DIPSTICK
Bilirubin, UA: NEGATIVE
Glucose, UA: NEGATIVE
Ketones, UA: NEGATIVE
Leukocytes, UA: NEGATIVE
NITRITE UA: NEGATIVE
PH UA: 6 (ref 5.0–8.0)
PROTEIN UA: NEGATIVE
SPEC GRAV UA: 1.02 (ref 1.010–1.025)
Urobilinogen, UA: 0.2 E.U./dL

## 2017-04-04 LAB — IFOBT (OCCULT BLOOD): IMMUNOLOGICAL FECAL OCCULT BLOOD TEST: NEGATIVE

## 2017-04-04 NOTE — Progress Notes (Signed)
Patient: Michelle Bradley, Female    DOB: 10-20-1960, 56 y.o.   MRN: [id number] Visit Date: 04/04/2017  Today's Provider: Wilhemena Durie, MD   Chief Complaint  Patient presents with  . Annual Exam   Subjective:    Annual physical exam Michelle Bradley is fasting for labs. She would like to discuss her hormone replacement therapy. She would like to decrease the dose of the hormones (estradial and medroxyprogesterone). She is concerned of possible side effects, including breast cancer. She does NOT have a family H/O breast cancer. Michelle Bradley is a 56 y.o. female who presents today for health maintenance and complete physical. She feels well. She reports she is not exercising regularly, but does walk all day long for her job. She reports she is sleeping well.working for local church. Son is engaged to be married and daughter is now   Last pap- 03/06/2014- negative Last mammogram- 07/05/2016- negative Last colonoscopy- 03/10/2014- internal hemorrhoids. -----------------------------------------------------------------   Review of Systems  Constitutional: Negative.   HENT: Negative.   Eyes: Negative.   Respiratory: Negative.   Cardiovascular: Negative.   Gastrointestinal: Negative.   Endocrine: Negative.   Genitourinary: Negative.   Musculoskeletal: Negative.   Skin: Negative.   Allergic/Immunologic: Negative.   Neurological: Negative.   Hematological: Negative.   Psychiatric/Behavioral: Negative.     Social History      She  reports that she has never smoked. She has never used smokeless tobacco. She reports that she does not drink alcohol or use drugs.       Social History   Social History  . Marital status: Married    Spouse name: Richardson Landry  . Number of children: 2  . Years of education: 15   Occupational History  . 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 Smoking status: Never Smoker  . Smokeless tobacco:  Never Used  . Alcohol use No  . Drug use: No  . Sexual activity: Yes    Birth control/ protection: None   Other Topics Concern  . None   Social History Narrative  . None    History reviewed. No pertinent past medical history.   Patient Active Problem List   Diagnosis Date Noted  . Serous otitis media 07/27/2015  . Migraine with aura 02/11/2015  . Hot flash, menopausal 02/11/2015  . Calculus of kidney 02/11/2015  . Menopausal symptom 02/11/2015  . Avitaminosis D 02/11/2015    Past Surgical History:  Procedure Laterality Date  . BREAST CYST ASPIRATION Left 2004  . CERVIX SURGERY     sawing up cervix internally  . CESAREAN SECTION  1985  . DILATION AND CURETTAGE OF UTERUS     x 2 after c section    Family History        Family Status  Relation Status  . Mother Deceased at age 66       leukemia  . Father Deceased at age 87       bone cancer  . Sister Alive  . Brother Alive  . Daughter Alive  . Son Alive  . Brother Alive  . Annamarie Major (Not Specified)        Her family history includes Bone cancer in her paternal uncle; Crohn's disease in her brother; Leukemia in her mother; Multiple myeloma in her father and paternal uncle; Multiple sclerosis in her brother.     No Known  Allergies   Current Outpatient Prescriptions:  .  estradiol (ESTRACE) 0.5 MG tablet, Take 1 tablet (0.5 mg total) by mouth daily., Disp: 30 tablet, Rfl: 12 .  medroxyPROGESTERone (PROVERA) 2.5 MG tablet, TAKE 1 TABLET BY MOUTH DAILY., Disp: 30 tablet, Rfl: 11 .  Multiple Vitamin (MULTIVITAMINS PO), Take 1 tablet by mouth daily., Disp: , Rfl:  .  venlafaxine XR (EFFEXOR-XR) 75 MG 24 hr capsule, TAKE 1 CAPSULE BY MOUTH EVERY DAY WITH BREAKFAST, Disp: 30 capsule, Rfl: 12   Patient Care Team: Jerrol Banana., MD as PCP - General (Family Medicine)      Objective:   Vitals: BP 116/62 (BP Location: Right Arm, Patient Position: Sitting, Cuff Size: Normal)   Pulse 88   Temp 97.7 F (36.5  C) (Oral)   Resp 16   Ht _0  (1.575 m)   Wt 146 lb (66.2 kg)   BMI 26.70 kg/m    Vitals:   04/04/17 0847  BP: 116/62  Pulse: 88  Resp: 16  Temp: 97.7 F (36.5 C)  TempSrc: Oral  Weight: 146 lb (66.2 kg)  Height: _1  (1.575 m)     Physical Exam  Constitutional: She is oriented to person, place, and time. She appears well-developed and well-nourished.  HENT:  Head: Normocephalic and atraumatic.  Right Ear: Tympanic membrane, external ear and ear canal normal.  Left Ear: Tympanic membrane, external ear and ear canal normal.  Nose: Nose normal.  Mouth/Throat: Uvula is midline, oropharynx is clear and moist and mucous membranes are normal.  Eyes: Conjunctivae, EOM and lids are normal. Pupils are equal, round, and reactive to light.  Neck: Trachea normal and normal range of motion. Neck supple. Carotid bruit is not present. No thyroid mass and no thyromegaly present.  Cardiovascular: Normal rate, regular rhythm, normal heart sounds and intact distal pulses.   Pulmonary/Chest: Effort normal and breath sounds normal.  Abdominal: Soft. Normal appearance and bowel sounds are normal. There is no hepatosplenomegaly. There is no tenderness.  Genitourinary: No breast swelling, tenderness or discharge.  Musculoskeletal: Normal range of motion.  Lymphadenopathy:    She has no cervical adenopathy.    She has no axillary adenopathy.  Neurological: She is alert and oriented to person, place, and time. She has normal strength. No cranial nerve deficit.  Skin: Skin is warm, dry and intact.  Psychiatric: She has a normal mood and affect. Her speech is normal and behavior is normal. Judgment and thought content normal. Cognition and memory are normal.     Depression Screen PHQ 2/9 Scores 04/04/2017 03/30/2016 03/26/2015  PHQ - 2 Score 0 0 0  PHQ- 9 Score 0 - -      Assessment & Plan:     Routine Health Maintenance and Physical Exam  Exercise Activities and Dietary  recommendations Goals    None      Immunization History  Administered Date(s) Administered  . Tdap 02/08/2012    Health Maintenance  Topic Date Due  . HIV Screening  12/05/1973  . PAP SMEAR  03/06/2017  . INFLUENZA VACCINE  05/17/2017  . MAMMOGRAM  07/05/2018  . TETANUS/TDAP  02/07/2022  . COLONOSCOPY  03/10/2024  . Hepatitis C Screening  Completed     Discussed health benefits of physical activity, and encouraged her to engage in regular exercise appropriate for her age and condition.    -------------------------------------------------------------------- 1. Annual physical exam Stable. Call for mammo order when due. - CBC with Differential/Platelet - Comprehensive metabolic panel -  Lipid panel - TSH - POCT urinalysis dipstick Results for orders placed or performed in visit on 04/04/17  POCT urinalysis dipstick  Result Value Ref Range   Color, UA yellow    Clarity, UA clear    Glucose, UA Negative    Bilirubin, UA Negative    Ketones, UA Negative    Spec Grav, UA 1.020 1.010 - 1.025   Blood, UA Hemolyzed small    pH, UA 6.0 5.0 - 8.0   Protein, UA Negative    Urobilinogen, UA 0.2 0.2 or 1.0 E.U./dL   Nitrite, UA Negative    Leukocytes, UA Negative Negative  POCT UA - Microscopic Only  Result Value Ref Range   WBC, Ur, HPF, POC Negative    RBC, urine, microscopic Negative    Bacteria, U Microscopic Negative    Mucus, UA Negative    Epithelial cells, urine per micros Negative    Crystals, Ur, HPF, POC Negative    Casts, Ur, LPF, POC Negative    Yeast, UA Negative   IFOBT POC (occult bld, rslt in office)  Result Value Ref Range   IFOBT Negative      2. Screening for cervical cancer FU pending results. - Pap IG and HPV (high risk) DNA detection  3. Screening for colon cancer Negative. FU colonoscopy 2025. - IFOBT POC (occult bld, rslt in office)  4. Other microscopic hematuria Urine dip positive for hemolyzed blood. Microscopic UA negative. Will  send for cx. - Urine Culture - POCT UA - Microscopic Only  5. Menopausal symptom   6. Hormone replacement therapy   I have done the exam and reviewed the above chart and it is accurate to the best of my knowledge. Development worker, community has been used in this note in any air is in the dictation or transcription are unintentional. I have done the exam and reviewed the above chart and it is accurate to the best of my knowledge. Development worker, community has been used in this note in any air is in the dictation or transcription are unintentional.  Wilhemena Durie, MD  [place]

## 2017-04-05 LAB — CBC WITH DIFFERENTIAL/PLATELET
Basophils Absolute: 0.1 10*3/uL (ref 0.0–0.2)
Basos: 1 %
EOS (ABSOLUTE): 0.1 10*3/uL (ref 0.0–0.4)
EOS: 1 %
HEMATOCRIT: 40.8 % (ref 34.0–46.6)
HEMOGLOBIN: 13.8 g/dL (ref 11.1–15.9)
IMMATURE GRANULOCYTES: 0 %
Immature Grans (Abs): 0 10*3/uL (ref 0.0–0.1)
Lymphocytes Absolute: 2.2 10*3/uL (ref 0.7–3.1)
Lymphs: 33 %
MCH: 29.6 pg (ref 26.6–33.0)
MCHC: 33.8 g/dL (ref 31.5–35.7)
MCV: 88 fL (ref 79–97)
MONOCYTES: 6 %
Monocytes Absolute: 0.4 10*3/uL (ref 0.1–0.9)
NEUTROS PCT: 59 %
Neutrophils Absolute: 4 10*3/uL (ref 1.4–7.0)
Platelets: 234 10*3/uL (ref 150–379)
RBC: 4.66 x10E6/uL (ref 3.77–5.28)
RDW: 13.6 % (ref 12.3–15.4)
WBC: 6.7 10*3/uL (ref 3.4–10.8)

## 2017-04-05 LAB — LIPID PANEL
CHOL/HDL RATIO: 4.8 ratio — AB (ref 0.0–4.4)
Cholesterol, Total: 247 mg/dL — ABNORMAL HIGH (ref 100–199)
HDL: 52 mg/dL (ref >39)
LDL CALC: 159 mg/dL — AB (ref 0–99)
TRIGLYCERIDES: 180 mg/dL — AB (ref 0–149)
VLDL Cholesterol Cal: 36 mg/dL (ref 5–40)

## 2017-04-05 LAB — COMPREHENSIVE METABOLIC PANEL
ALBUMIN: 4.7 g/dL (ref 3.5–5.5)
ALT: 16 IU/L (ref 0–32)
AST: 19 IU/L (ref 0–40)
Albumin/Globulin Ratio: 1.6 (ref 1.2–2.2)
Alkaline Phosphatase: 81 IU/L (ref 39–117)
BUN / CREAT RATIO: 18 (ref 9–23)
BUN: 11 mg/dL (ref 6–24)
Bilirubin Total: 0.4 mg/dL (ref 0.0–1.2)
CO2: 18 mmol/L — AB (ref 20–29)
CREATININE: 0.61 mg/dL (ref 0.57–1.00)
Calcium: 9.4 mg/dL (ref 8.7–10.2)
Chloride: 102 mmol/L (ref 96–106)
GFR calc Af Amer: 116 mL/min/{1.73_m2} (ref >59)
GFR calc non Af Amer: 100 mL/min/{1.73_m2} (ref >59)
GLOBULIN, TOTAL: 3 g/dL (ref 1.5–4.5)
Glucose: 87 mg/dL (ref 65–99)
Potassium: 4.2 mmol/L (ref 3.5–5.2)
SODIUM: 140 mmol/L (ref 134–144)
Total Protein: 7.7 g/dL (ref 6.0–8.5)

## 2017-04-05 LAB — TSH: TSH: 1.54 u[IU]/mL (ref 0.450–4.500)

## 2017-04-05 NOTE — Progress Notes (Signed)
Advised  ED 

## 2017-04-06 LAB — PAP IG AND HPV HIGH-RISK
HPV, high-risk: NEGATIVE
PAP Smear Comment: 0

## 2017-04-06 LAB — URINE CULTURE: Organism ID, Bacteria: NO GROWTH

## 2017-04-07 NOTE — Progress Notes (Signed)
Advised  ED 

## 2017-04-14 ENCOUNTER — Other Ambulatory Visit: Payer: Self-pay | Admitting: Family Medicine

## 2017-04-18 ENCOUNTER — Other Ambulatory Visit: Payer: Self-pay

## 2017-04-18 MED ORDER — MEDROXYPROGESTERONE ACETATE 2.5 MG PO TABS: 2.5000 mg | ORAL_TABLET | Freq: Every day | ORAL | 5 refills | Status: DC

## 2017-04-18 MED ORDER — MEDROXYPROGESTERONE ACETATE 2.5 MG PO TABS: 2.5000 mg | ORAL_TABLET | Freq: Every day | ORAL | 0 refills | Status: DC

## 2017-04-18 MED ORDER — ESTRADIOL 0.5 MG PO TABS: 0.5000 mg | ORAL_TABLET | Freq: Every day | ORAL | 5 refills | Status: DC

## 2017-04-18 MED ORDER — VENLAFAXINE HCL ER 75 MG PO CP24: 75.0000 mg | ORAL_CAPSULE | Freq: Every day | ORAL | 5 refills | Status: DC

## 2017-04-18 MED ORDER — ESTRADIOL 0.5 MG PO TABS: 0.5000 mg | ORAL_TABLET | Freq: Every day | ORAL | 0 refills | Status: DC

## 2017-04-18 MED ORDER — VENLAFAXINE HCL ER 75 MG PO CP24: 75.0000 mg | ORAL_CAPSULE | Freq: Every day | ORAL | 0 refills | Status: DC

## 2017-04-18 NOTE — Telephone Encounter (Signed)
Patient called requesting refills on the medications pulled down in meds and order. Patient requests 90 day supply instead of 30 days please. Please review for dr Rosanna Randy. Thank you-aa

## 2017-07-16 ENCOUNTER — Other Ambulatory Visit: Payer: Self-pay | Admitting: Physician Assistant

## 2018-01-14 ENCOUNTER — Other Ambulatory Visit: Payer: Self-pay | Admitting: Physician Assistant

## 2018-03-19 ENCOUNTER — Other Ambulatory Visit: Payer: Self-pay | Admitting: Family Medicine

## 2018-04-20 ENCOUNTER — Other Ambulatory Visit: Payer: Self-pay | Admitting: Physician Assistant

## 2018-04-23 NOTE — Telephone Encounter (Signed)
Please review

## 2018-04-24 ENCOUNTER — Ambulatory Visit (INDEPENDENT_AMBULATORY_CARE_PROVIDER_SITE_OTHER): Payer: BLUE CROSS/BLUE SHIELD | Admitting: Family Medicine

## 2018-04-24 ENCOUNTER — Encounter: Payer: Self-pay | Admitting: Family Medicine

## 2018-04-24 VITALS — BP 118/64 | HR 80 | Temp 98.4°F | Resp 16 | Ht 62.0 in | Wt 146.0 lb

## 2018-04-24 DIAGNOSIS — Z0000 Encounter for general adult medical examination without abnormal findings: Secondary | ICD-10-CM

## 2018-04-24 DIAGNOSIS — L570 Actinic keratosis: Secondary | ICD-10-CM

## 2018-04-24 NOTE — Progress Notes (Signed)
Patient: Michelle Bradley, Female    DOB: November 09, 1960, 57 y.o.   MRN: [id number] Visit Date: 04/24/2018  Today's Provider: Wilhemena Durie, MD   Chief Complaint  Patient presents with  . Annual Exam   Subjective:    Annual physical exam Michelle Bradley is a 57 y.o. female who presents today for health maintenance and complete physical. She feels well. She reports exercising occasionally. She reports she is sleeping well. She is married and is the mother of 2.  Both children of yet married in the past year.  She is very pleased about this.  She is now working full-time at [employer]. Colonoscopy- 03/10/2014. Internal hemorrhoids.  Pap- 04/04/2017. Normal and HPV Negative.  Mammogram- 07/06/2016. Normal.    Review of Systems  Constitutional: Negative.   HENT: Negative.   Eyes: Negative.   Respiratory: Negative.   Cardiovascular: Negative.   Gastrointestinal: Negative.   Endocrine: Negative.   Genitourinary: Negative.   Musculoskeletal: Negative.   Skin: Negative.   Neurological: Negative.   Hematological: Negative.   Psychiatric/Behavioral: Negative.     Social History      She  reports that she has never smoked. She has never used smokeless tobacco. She reports that she does not drink alcohol or use drugs.       Social History   Socioeconomic History  . Marital status: Married    Spouse name: Richardson Landry  . Number of children: 2  . Years of education: 48  . Highest education level: Not on file  Occupational History  . Occupation: Manufacturing engineer    Comment: Museum/gallery curator in [place]  . Financial resource strain: Not on file  . Food insecurity:    Worry: Not on file    Inability: Not on file  . Transportation needs:    Medical: Not on file    Non-medical: Not on file  Tobacco Use  . Smoking status: Never Smoker  . Smokeless tobacco: Never Used  Substance and Sexual Activity  . Alcohol use: No  . Drug use: No  . Sexual  activity: Yes    Birth control/protection: None  Lifestyle  . Physical activity:    Days per week: Not on file    Minutes per session: Not on file  . Stress: Not on file  Relationships  . Social connections:    Talks on phone: Not on file    Gets together: Not on file    Attends religious service: Not on file    Active member of club or organization: Not on file    Attends meetings of clubs or organizations: Not on file    Relationship status: Not on file  Other Topics Concern  . Not on file  Social History Narrative  . Not on file    No past medical history on file.   Patient Active Problem List   Diagnosis Date Noted  . Hormone replacement therapy 04/04/2017  . Migraine with aura 02/11/2015  . Hot flash, menopausal 02/11/2015  . Calculus of kidney 02/11/2015  . Menopausal symptom 02/11/2015  . Avitaminosis D 02/11/2015    Past Surgical History:  Procedure Laterality Date  . BREAST CYST ASPIRATION Left 2004  . CERVIX SURGERY     sawing up cervix internally  . CESAREAN SECTION  1985  . DILATION AND CURETTAGE OF UTERUS     x 2 after c section    Family History  Family Status  Relation Name Status  . Mother  Deceased at age 16       leukemia  . Father  Deceased at age 47       bone cancer  . Sister  Alive  . Brother  Alive  . Daughter  Alive  . Son  Alive  . Brother  Alive  . Annamarie Major  (Not Specified)        Her family history includes Bone cancer in her paternal uncle; Crohn's disease in her brother; Leukemia in her mother; Multiple myeloma in her father and paternal uncle; Multiple sclerosis in her brother.      No Known Allergies   Current Outpatient Medications:  .  estradiol (ESTRACE) 0.5 MG tablet, TAKE 1 TABLET(0.5 MG) BY MOUTH DAILY, Disp: 90 tablet, Rfl: 3 .  medroxyPROGESTERone (PROVERA) 2.5 MG tablet, TAKE 1 TABLET(2.5 MG) BY MOUTH DAILY, Disp: 90 tablet, Rfl: 2 .  Multiple Vitamin (MULTIVITAMINS PO), Take 1 tablet by mouth daily.,  Disp: , Rfl:  .  venlafaxine XR (EFFEXOR-XR) 75 MG 24 hr capsule, TAKE 1 CAPSULE(75 MG) BY MOUTH DAILY WITH BREAKFAST, Disp: 90 capsule, Rfl: 3 .  estradiol (ESTRACE) 0.5 MG tablet, TAKE 1 TABLET(0.5 MG) BY MOUTH DAILY (Patient not taking: Reported on 04/24/2018), Disp: 30 tablet, Rfl: 11   Patient Care Team: Jerrol Banana., MD as PCP - General (Family Medicine)      Objective:   Vitals: BP 118/64 (BP Location: Left Arm, Patient Position: Sitting, Cuff Size: Normal)   Pulse 80   Temp 98.4 F (36.9 C)   Resp 16   Ht '5\' 2"'$  (1.575 m)   Wt 146 lb (66.2 kg)   SpO2 99%   BMI 26.70 kg/m    Vitals:   04/24/18 0929  BP: 118/64  Pulse: 80  Resp: 16  Temp: 98.4 F (36.9 C)  SpO2: 99%  Weight: 146 lb (66.2 kg)  Height: '5\' 2"'$  (1.575 m)     Physical Exam  Constitutional: She is oriented to person, place, and time. She appears well-developed and well-nourished.  HENT:  Head: Normocephalic and atraumatic.  Right Ear: External ear normal.  Left Ear: External ear normal.  Nose: Nose normal.  Eyes: Conjunctivae are normal. No scleral icterus.  Neck: No thyromegaly present.  Cardiovascular: Normal rate, regular rhythm and normal heart sounds.  Pulmonary/Chest: Effort normal and breath sounds normal.  Abdominal: Soft.  Musculoskeletal: She exhibits no edema.  Neurological: She is alert and oriented to person, place, and time.  Skin: Skin is warm and dry.  Psychiatric: She has a normal mood and affect. Her behavior is normal. Judgment and thought content normal.     Depression Screen PHQ 2/9 Scores 04/24/2018 04/04/2017 03/30/2016 03/26/2015  PHQ - 2 Score 0 0 0 0  PHQ- 9 Score 0 0 - -      Assessment & Plan:     Routine Health Maintenance and Physical Exam  Exercise Activities and Dietary recommendations Goals    None      Immunization History  Administered Date(s) Administered  . Tdap 02/08/2012    Health Maintenance  Topic Date Due  . HIV Screening   12/05/1973  . INFLUENZA VACCINE  05/17/2018  . MAMMOGRAM  07/05/2018  . PAP SMEAR  04/04/2020  . TETANUS/TDAP  02/07/2022  . COLONOSCOPY  03/10/2024  . Hepatitis C Screening  Completed    Pap 2023,Mammogram now,colonoscopy 2015. Discussed health benefits of physical activity, and encouraged her  to engage in regular exercise appropriate for her age and condition.  AKs Refer to derm.  I have done the exam and reviewed the above chart and it is accurate to the best of my knowledge. Development worker, community has been used in this note in any air is in the dictation or transcription are unintentional.  Wilhemena Durie, MD  [place]

## 2018-04-24 NOTE — Patient Instructions (Addendum)
Call [phone] to schedule your mammogram.

## 2018-04-25 LAB — COMPREHENSIVE METABOLIC PANEL
ALT: 18 IU/L (ref 0–32)
AST: 16 IU/L (ref 0–40)
Albumin/Globulin Ratio: 1.6 (ref 1.2–2.2)
Albumin: 4.6 g/dL (ref 3.5–5.5)
Alkaline Phosphatase: 83 IU/L (ref 39–117)
BUN/Creatinine Ratio: 19 (ref 9–23)
BUN: 12 mg/dL (ref 6–24)
Bilirubin Total: 0.3 mg/dL (ref 0.0–1.2)
CO2: 22 mmol/L (ref 20–29)
Calcium: 9.6 mg/dL (ref 8.7–10.2)
Chloride: 102 mmol/L (ref 96–106)
Creatinine, Ser: 0.63 mg/dL (ref 0.57–1.00)
GFR calc Af Amer: 114 mL/min/{1.73_m2} (ref >59)
GFR calc non Af Amer: 98 mL/min/{1.73_m2} (ref >59)
Globulin, Total: 2.8 g/dL (ref 1.5–4.5)
Glucose: 87 mg/dL (ref 65–99)
Potassium: 4.3 mmol/L (ref 3.5–5.2)
Sodium: 140 mmol/L (ref 134–144)
Total Protein: 7.4 g/dL (ref 6.0–8.5)

## 2018-04-25 LAB — CBC WITH DIFFERENTIAL/PLATELET
BASOS ABS: 0.1 10*3/uL (ref 0.0–0.2)
Basos: 1 %
EOS (ABSOLUTE): 0.1 10*3/uL (ref 0.0–0.4)
Eos: 1 %
Hematocrit: 40.2 % (ref 34.0–46.6)
Hemoglobin: 13.7 g/dL (ref 11.1–15.9)
IMMATURE GRANS (ABS): 0 10*3/uL (ref 0.0–0.1)
IMMATURE GRANULOCYTES: 0 %
Lymphocytes Absolute: 2.1 10*3/uL (ref 0.7–3.1)
Lymphs: 30 %
MCH: 30.2 pg (ref 26.6–33.0)
MCHC: 34.1 g/dL (ref 31.5–35.7)
MCV: 89 fL (ref 79–97)
Monocytes Absolute: 0.3 10*3/uL (ref 0.1–0.9)
Monocytes: 5 %
NEUTROS PCT: 63 %
Neutrophils Absolute: 4.5 10*3/uL (ref 1.4–7.0)
Platelets: 251 10*3/uL (ref 150–450)
RBC: 4.54 x10E6/uL (ref 3.77–5.28)
RDW: 13.4 % (ref 12.3–15.4)
WBC: 7 10*3/uL (ref 3.4–10.8)

## 2018-04-25 LAB — LIPID PANEL
CHOL/HDL RATIO: 5 ratio — AB (ref 0.0–4.4)
Cholesterol, Total: 237 mg/dL — ABNORMAL HIGH (ref 100–199)
HDL: 47 mg/dL (ref >39)
LDL Calculated: 153 mg/dL — ABNORMAL HIGH (ref 0–99)
TRIGLYCERIDES: 184 mg/dL — AB (ref 0–149)
VLDL CHOLESTEROL CAL: 37 mg/dL (ref 5–40)

## 2018-04-25 LAB — TSH: TSH: 1.07 u[IU]/mL (ref 0.450–4.500)

## 2018-04-26 ENCOUNTER — Other Ambulatory Visit: Payer: Self-pay | Admitting: Family Medicine

## 2018-04-26 DIAGNOSIS — Z1231 Encounter for screening mammogram for malignant neoplasm of breast: Secondary | ICD-10-CM

## 2018-05-17 ENCOUNTER — Ambulatory Visit
Admission: RE | Admit: 2018-05-17 | Discharge: 2018-05-17 | Disposition: A | Discharge status: Home / Self Care | Payer: BLUE CROSS/BLUE SHIELD | Source: Ambulatory Visit | Attending: Family Medicine | Admitting: Family Medicine

## 2018-05-17 DIAGNOSIS — Z1231 Encounter for screening mammogram for malignant neoplasm of breast: Secondary | ICD-10-CM | POA: Diagnosis not present

## 2018-07-11 ENCOUNTER — Other Ambulatory Visit: Payer: Self-pay | Admitting: Family Medicine

## 2018-09-17 DIAGNOSIS — Z23 Encounter for immunization: Secondary | ICD-10-CM | POA: Diagnosis not present

## 2018-10-14 ENCOUNTER — Other Ambulatory Visit: Payer: Self-pay | Admitting: Family Medicine

## 2019-01-18 ENCOUNTER — Other Ambulatory Visit: Payer: Self-pay | Admitting: Family Medicine

## 2019-01-23 ENCOUNTER — Telehealth: Payer: Self-pay

## 2019-01-23 NOTE — Telephone Encounter (Signed)
I think [facility] has a $4 list.  I would see what is on there for in the way of antidepressants.  Ask about SSRI.  Let us know we will  switch her to 1 of those.

## 2019-01-23 NOTE — Telephone Encounter (Signed)
Pt called stating she lost her insurance.  She is wanting to know if there is something cheaper than venlafaxine.  Please advise.   Thanks,   -Mickel Baas

## 2019-01-24 NOTE — Telephone Encounter (Signed)
At that price she may choose to stay with the venlafaxine.  We can also do the sertraline at 100 mg daily.  If she does sertraline I would use 1/2 tablet daily for a month and get then go to a full tablet

## 2019-01-24 NOTE — Telephone Encounter (Signed)
Left vm for pt to return call.  dbs

## 2019-01-24 NOTE — Telephone Encounter (Signed)
Paroxetine 40mg  and Sertraline 25mg  and 100mg  are $9 on the list at [facility]. Venlafaxine is $15 for 60 capsules.

## 2019-01-24 NOTE — Telephone Encounter (Signed)
Advised patient as below.  

## 2019-04-10 ENCOUNTER — Other Ambulatory Visit: Payer: Self-pay | Admitting: Family Medicine

## 2019-04-10 MED ORDER — ESTRADIOL 0.5 MG PO TABS: ORAL_TABLET | ORAL | 3 refills | Status: DC

## 2019-04-10 NOTE — Telephone Encounter (Signed)
L.O.V. 04/24/2018 and upcoming appointment 07/15/2019, please advise.

## 2019-04-10 NOTE — Telephone Encounter (Signed)
Pt is out of refills on:  estradiol (ESTRACE) 0.5 MG tablet - 90 day supply  Please call into:  [place], [place] - [place] [phone] ([phone] (Fax)   Thanks, [employer]

## 2019-04-30 ENCOUNTER — Encounter: Payer: Self-pay | Admitting: Family Medicine

## 2019-07-15 ENCOUNTER — Encounter: Payer: BLUE CROSS/BLUE SHIELD | Admitting: Family Medicine

## 2019-07-23 ENCOUNTER — Other Ambulatory Visit: Payer: Self-pay | Admitting: Family Medicine

## 2019-10-21 ENCOUNTER — Encounter: Payer: BLUE CROSS/BLUE SHIELD | Admitting: Family Medicine

## 2019-10-22 ENCOUNTER — Other Ambulatory Visit: Payer: Self-pay | Admitting: Family Medicine

## 2019-10-22 NOTE — Telephone Encounter (Signed)
Requested medication (s) are due for refill today: yes  Requested medication (s) are on the active medication list: yes  Last refill: 07/25/2019  Future visit scheduled: yes  Notes to clinic:  no valid encounter in last 12 months    Requested Prescriptions  Pending Prescriptions Disp Refills   medroxyPROGESTERone (PROVERA) 2.5 MG tablet [Pharmacy Med Name: medroxyPROGESTERone Acetate 2.5 MG Oral Tablet] 90 tablet 0    Sig: Take 1 tablet by mouth once daily      OB/GYN:  Progestins Failed - 10/22/2019  8:54 AM      Failed - Valid encounter within last 12 months    Recent Outpatient Visits           1 year ago Annual physical exam   [facility] Jerrol Banana., MD   2 years ago Annual physical exam   [facility] Jerrol Banana., MD   3 years ago Annual physical exam   [facility] Jerrol Banana., MD   4 years ago Serous otitis media, recurrence not specified, unspecified chronicity, unspecified laterality   [facility] [place], [place], [place]   4 years ago Acute suppurative otitis media of left ear without spontaneous rupture of tympanic membrane, recurrence not specified   [facility] [place], [place], [place]

## 2019-10-31 ENCOUNTER — Telehealth: Payer: Self-pay

## 2019-10-31 NOTE — Telephone Encounter (Signed)
Copied from [place] [phone]. Topic: Appointment Scheduling - Scheduling Inquiry for Clinic >> Oct 30, 2019  3:02 PM Percell Belt A wrote: Reason for CRM: pt called in an stated that her cpe has been rec'd a few time and and would like to know if she can put sch asap on a Monday.  She ok with Dr Rosanna Randy  or anyone else.  System will not let me resch do to the cpe block

## 2019-10-31 NOTE — Telephone Encounter (Signed)
Called patient and scheduled for 11/11/19 w/ Adriana.  Patient had been resc a few times and didn't want to keep waiting so she said she'd see anyone for her CPE.

## 2019-11-04 ENCOUNTER — Encounter: Payer: Self-pay | Admitting: Family Medicine

## 2019-11-06 ENCOUNTER — Ambulatory Visit: Payer: Self-pay | Admitting: Family Medicine

## 2019-11-11 ENCOUNTER — Encounter: Payer: Self-pay | Admitting: Physician Assistant

## 2019-11-14 ENCOUNTER — Other Ambulatory Visit: Payer: Self-pay

## 2019-11-14 ENCOUNTER — Encounter: Payer: Self-pay | Admitting: Advanced Practice Midwife

## 2019-11-14 ENCOUNTER — Ambulatory Visit (INDEPENDENT_AMBULATORY_CARE_PROVIDER_SITE_OTHER): Payer: 59 | Admitting: Advanced Practice Midwife

## 2019-11-14 VITALS — BP 131/77 | HR 109 | Ht 62.0 in | Wt 156.0 lb

## 2019-11-14 DIAGNOSIS — N644 Mastodynia: Secondary | ICD-10-CM

## 2019-11-14 DIAGNOSIS — N6320 Unspecified lump in the left breast, unspecified quadrant: Secondary | ICD-10-CM | POA: Diagnosis not present

## 2019-11-14 NOTE — Progress Notes (Signed)
Patient ID: Michelle Bradley, female   DOB: January 27, 1961, 59 y.o.   MRN: [id number]  Reason for Consult: Breast Pain (Left breast lump)    Subjective:     HPI:   Michelle Bradley is a 59 y.o. female is seen today for left breast pain and lump that she first noticed today. She was in exercise class and felt the pain while doing a jumping exercise. She then felt the lump later while showering. She notices the pain only when pressing on the lump. She denies any recent trauma to the area or wearing a new bra. She has a history of a cyst also in her left breast which was aspirated and benign about 12 years ago. Her most recent mammogram was 1.5 years ago: BiRads I/dense breast tissue.  She denies any other concerns.  Past Medical History:  Diagnosis Date  . Anxiety    Family History  Problem Relation Age of Onset  . Leukemia Mother   . Multiple myeloma Father   . Multiple sclerosis Brother   . Crohn's disease Brother   . Bone cancer Paternal Uncle   . Multiple myeloma Paternal Uncle   . Breast cancer Neg Hx    Past Surgical History:  Procedure Laterality Date  . BREAST CYST ASPIRATION Left 2004  . CERVIX SURGERY     sawing up cervix internally  . CESAREAN SECTION  1985  . DILATION AND CURETTAGE OF UTERUS     x 2 after c section    Short Social History:  Social History   Tobacco Use  . Smoking status: Never Smoker  . Smokeless tobacco: Never Used  Substance Use Topics  . Alcohol use: No    No Known Allergies  Current Outpatient Medications  Medication Sig Dispense Refill  . estradiol (ESTRACE) 0.5 MG tablet TAKE 1 TABLET(0.5 MG) BY MOUTH DAILY 90 tablet 3  . medroxyPROGESTERone (PROVERA) 2.5 MG tablet Take 1 tablet by mouth once daily 60 tablet 0  . Multiple Vitamin (MULTIVITAMINS PO) Take 1 tablet by mouth daily.    Marland Kitchen venlafaxine XR (EFFEXOR-XR) 75 MG 24 hr capsule TAKE 1 CAPSULE(75 MG) BY MOUTH DAILY WITH BREAKFAST 90 capsule 3   No current facility-administered  medications for this visit.   Review of Systems  Constitutional: Negative.   HENT: Negative.   Eyes: Negative.   Respiratory: Negative.   Cardiovascular: Negative.   Gastrointestinal: Negative.   Genitourinary: Negative.   Musculoskeletal: Negative.   Skin: Negative.   Neurological: Negative.   Endo/Heme/Allergies: Negative.   Psychiatric/Behavioral: Negative.   Breast: pain and lump in left breast      Objective:  Objective   Vitals:   11/14/19 1333  BP: 131/77  Pulse: (!) 109  Weight: 156 lb (70.8 kg)  Height: '5\' 2"'$  (1.575 m)   Body mass index is 28.53 kg/m. Constitutional: Well nourished, well developed female in no acute distress.  HEENT: normal Skin: Warm and dry.  Cardiovascular: Regular rate and rhythm.   Breast: Right Breast: no masses, non-tender to palpation, no skin changes. Left Breast: approximately 6 cm x 3 cm ropey mass just superior to nipple and extending from 10:00 o'clock to 2:00 o'clock, tender to palpation, no skin changes. Extremity: no edema  Respiratory: Clear to auscultation bilateral. Normal respiratory effort Neuro: DTRs 2+, Cranial nerves grossly intact Psych: Alert and Oriented x3. No memory deficits. Normal mood and affect.  MS: normal gait, normal bilateral lower extremity ROM/strength/stability.  Assessment/Plan:     59 year old G5 P2032 female with left breast mass  Diagnostic mammogram and ultrasounds ordered Follow up as needed     [facility] 11/14/2019, 5:29 PM

## 2019-11-21 ENCOUNTER — Ambulatory Visit
Admission: RE | Admit: 2019-11-21 | Discharge: 2019-11-21 | Disposition: A | Discharge status: Home / Self Care | Payer: 59 | Source: Ambulatory Visit | Attending: Advanced Practice Midwife | Admitting: Advanced Practice Midwife

## 2019-11-21 DIAGNOSIS — N6320 Unspecified lump in the left breast, unspecified quadrant: Secondary | ICD-10-CM | POA: Insufficient documentation

## 2019-12-19 ENCOUNTER — Other Ambulatory Visit: Payer: Self-pay | Admitting: Family Medicine

## 2019-12-19 NOTE — Telephone Encounter (Signed)
Requested Prescriptions  Pending Prescriptions Disp Refills  . medroxyPROGESTERone (PROVERA) 2.5 MG tablet [Pharmacy Med Name: medroxyPROGESTERone Acetate 2.5 MG Oral Tablet] 30 tablet 0    Sig: Take 1 tablet by mouth once daily     OB/GYN:  Progestins Failed - 12/19/2019  1:31 PM      Failed - Valid encounter within last 12 months    Recent Outpatient Visits          1 year ago Annual physical exam   [facility] Jerrol Banana., MD   2 years ago Annual physical exam   [facility] Jerrol Banana., MD   3 years ago Annual physical exam   [facility] Jerrol Banana., MD   4 years ago Serous otitis media, recurrence not specified, unspecified chronicity, unspecified laterality   [facility] [place], [place], [place]   4 years ago Acute suppurative otitis media of left ear without spontaneous rupture of tympanic membrane, recurrence not specified   [facility] [place], [place], [place]      Future Appointments            In 1 week Jerrol Banana., MD [facility], [place]

## 2019-12-19 NOTE — Telephone Encounter (Signed)
Contacted pt due to refill request; pt's last physical 04/24/18; pt offered and accepted appt for med refills 12/30/19 at 0949; she verbalized understanding; spoke with [facility] in regards to scheduling; will route to office for notification; will grant 30 day courtesy refill.

## 2019-12-27 NOTE — Progress Notes (Signed)
       Patient: Michelle Bradley Female    DOB: 01-27-1961   59 y.o.   MRN: [id number] Visit Date: 12/30/2019  Today's Provider: Wilhemena Durie, MD   Chief Complaint  Patient presents with  . Medication Refill   Subjective:     HPI  Now working for a [employer]--very happy. She exercises 4 days per week. Patient presents today for medication refills.   No Known Allergies   Current Outpatient Medications:  .  estradiol (ESTRACE) 0.5 MG tablet, TAKE 1 TABLET(0.5 MG) BY MOUTH DAILY, Disp: 90 tablet, Rfl: 3 .  medroxyPROGESTERone (PROVERA) 2.5 MG tablet, Take 1 tablet by mouth once daily, Disp: 30 tablet, Rfl: 0 .  Multiple Vitamin (MULTIVITAMINS PO), Take 1 tablet by mouth daily., Disp: , Rfl:  .  venlafaxine XR (EFFEXOR-XR) 75 MG 24 hr capsule, TAKE 1 CAPSULE(75 MG) BY MOUTH DAILY WITH BREAKFAST, Disp: 90 capsule, Rfl: 3  Review of Systems  Constitutional: Negative for appetite change, chills, fatigue and fever.  Eyes: Negative.   Respiratory: Negative for chest tightness and shortness of breath.   Cardiovascular: Negative for chest pain and palpitations.  Gastrointestinal: Negative for abdominal pain, nausea and vomiting.  Endocrine: Negative.   Genitourinary: Negative.   Allergic/Immunologic: Negative.   Neurological: Negative for dizziness and weakness.  Psychiatric/Behavioral: Negative.     Social History   Tobacco Use  . Smoking status: Never Smoker  . Smokeless tobacco: Never Used  Substance Use Topics  . Alcohol use: No      Objective:   There were no vitals taken for this visit. There were no vitals filed for this visit.There is no height or weight on file to calculate BMI.   Physical Exam Vitals reviewed.  HENT:     Head: Normocephalic and atraumatic.  Eyes:     General: No scleral icterus.    Conjunctiva/sclera: Conjunctivae normal.  Cardiovascular:     Rate and Rhythm: Normal rate and regular rhythm.     Heart sounds: Normal heart  sounds.  Pulmonary:     Effort: Pulmonary effort is normal.     Breath sounds: Normal breath sounds.  Neurological:     General: No focal deficit present.     Mental Status: She is alert and oriented to person, place, and time.  Psychiatric:        Mood and Affect: Mood normal.        Behavior: Behavior normal.        Thought Content: Thought content normal.        Judgment: Judgment normal.      No results found for any visits on 12/30/19.     Assessment & Plan    1. Hormone replacement therapy No  Vaginal bleeding.Continue HRT--discuss at time of CPE this summer.  2. Menopausal symptom   3. Hot flash, menopausal      Wilhemena Durie, MD  [facility]

## 2019-12-30 ENCOUNTER — Ambulatory Visit (INDEPENDENT_AMBULATORY_CARE_PROVIDER_SITE_OTHER): Payer: 59 | Admitting: Family Medicine

## 2019-12-30 ENCOUNTER — Other Ambulatory Visit: Payer: Self-pay

## 2019-12-30 ENCOUNTER — Encounter: Payer: Self-pay | Admitting: Family Medicine

## 2019-12-30 VITALS — BP 122/78 | HR 102 | Temp 95.3°F | Wt 157.6 lb

## 2019-12-30 DIAGNOSIS — Z79890 Hormone replacement therapy (postmenopausal): Secondary | ICD-10-CM | POA: Diagnosis not present

## 2019-12-30 DIAGNOSIS — N951 Menopausal and female climacteric states: Secondary | ICD-10-CM

## 2019-12-30 DIAGNOSIS — G43101 Migraine with aura, not intractable, with status migrainosus: Secondary | ICD-10-CM

## 2019-12-30 MED ORDER — MEDROXYPROGESTERONE ACETATE 2.5 MG PO TABS: 2.5000 mg | ORAL_TABLET | Freq: Every day | ORAL | 3 refills | Status: DC

## 2019-12-30 MED ORDER — VENLAFAXINE HCL ER 75 MG PO CP24: ORAL_CAPSULE | ORAL | 3 refills | Status: DC

## 2019-12-30 MED ORDER — ESTRADIOL 0.5 MG PO TABS: ORAL_TABLET | ORAL | 3 refills | Status: DC

## 2019-12-30 NOTE — Telephone Encounter (Signed)
Please advise 

## 2020-06-26 NOTE — Progress Notes (Signed)
I,April Miller,acting as a scribe for Wilhemena Durie, MD.,have documented all relevant documentation on the behalf of Wilhemena Durie, MD,as directed by  Wilhemena Durie, MD while in the presence of Wilhemena Durie, MD.   Complete physical exam   Patient: Michelle Bradley   DOB: June 02, 1961   59 y.o. Female  MRN: [id number] Visit Date: 06/29/2020  Today's healthcare provider: Wilhemena Durie, MD   Chief Complaint  Patient presents with   Annual Exam   Subjective    Michelle Bradley is a 59 y.o. female who presents today for a complete physical exam.  She reports consuming a general diet. Gym/ health club routine includes gym 4 days a week. She generally feels well. She reports sleeping well. She does not have additional problems to discuss today.  She is married and has 2 children and 1 grandparent. HPI  On turmeric daily.  Past Medical History:  Diagnosis Date   Anxiety    Past Surgical History:  Procedure Laterality Date   BREAST CYST ASPIRATION Left 2004   CERVIX SURGERY     sawing up cervix internally   CESAREAN SECTION  1985   DILATION AND CURETTAGE OF UTERUS     x 2 after c section   Social History   Socioeconomic History   Marital status: Married    Spouse name: Richardson Landry   Number of children: 2   Years of education: 15   Highest education level: Not on file  Occupational History   Occupation: Manufacturing engineer    Comment: [employer] in [place]  Tobacco Use   Smoking status: Never Smoker   Smokeless tobacco: Never Used  Scientific laboratory technician Use: Never used  Substance and Sexual Activity   Alcohol use: No   Drug use: No   Sexual activity: Yes    Birth control/protection: None  Other Topics Concern   Not on file  Social History Narrative   Not on file   Social Determinants of Health   Financial Resource Strain:    Difficulty of Paying Living Expenses: Not on file  Food Insecurity:    Worried About  Charity fundraiser in the Last Year: Not on file   [employer] of Food in the Last Year: Not on file  Transportation Needs:    Lack of Transportation (Medical): Not on file   Lack of Transportation (Non-Medical): Not on file  Physical Activity:    Days of Exercise per Week: Not on file   Minutes of Exercise per Session: Not on file  Stress:    Feeling of Stress : Not on file  Social Connections:    Frequency of Communication with Friends and Family: Not on file   Frequency of Social Gatherings with Friends and Family: Not on file   Attends Religious Services: Not on file   Active Member of Clubs or Organizations: Not on file   Attends Archivist Meetings: Not on file   Marital Status: Not on file  Intimate Partner Violence:    Fear of Current or Ex-Partner: Not on file   Emotionally Abused: Not on file   Physically Abused: Not on file   Sexually Abused: Not on file   Family Status  Relation Name Status   Mother  Deceased at age 40       leukemia   Father  Deceased at age 71       bone cancer  Sister  Alive  °• Brother  Alive  °• Daughter  Alive  °• Son  Alive  °• Brother  Alive  °• Pat Uncle  (Not Specified)  °• Neg Hx  (Not Specified)  ° °Family History  °Problem Relation Age of Onset  °• Leukemia Mother   °• Multiple myeloma Father   °• Multiple sclerosis Brother   °• Crohn's disease Brother   °• Bone cancer Paternal Uncle   °• Multiple myeloma Paternal Uncle   °• Breast cancer Neg Hx   ° °No Known Allergies  °Patient Care Team: °Gilbert, Richard L Jr., MD as PCP - General (Family Medicine)  ° °Medications: °Outpatient Medications Prior to Visit  °Medication Sig  °• estradiol (ESTRACE) 0.5 MG tablet TAKE 1 TABLET(0.5 MG) BY MOUTH DAILY  °• loratadine (CLARITIN) 10 MG tablet Take 10 mg by mouth daily.  °• medroxyPROGESTERone (PROVERA) 2.5 MG tablet Take 1 tablet (2.5 mg total) by mouth daily.  °• Multiple Vitamin (MULTIVITAMINS PO) Take 1 tablet by mouth  daily.  °• Turmeric Curcumin 500 MG CAPS Take 400 mg by mouth once.  °• venlafaxine XR (EFFEXOR-XR) 75 MG 24 hr capsule TAKE 1 CAPSULE(75 MG) BY MOUTH DAILY WITH BREAKFAST  ° °No facility-administered medications prior to visit.  ° ° °Review of Systems  °All other systems reviewed and are negative. ° ° °  ° Objective  °  °BP 120/83 (BP Location: Right Arm, Patient Position: Sitting, Cuff Size: Large)    Pulse 90    Temp 98.6 °F (37 °C) (Oral)    Resp 16    Ht 5' 2" (1.575 m)    Wt 157 lb (71.2 kg)    SpO2 95%    BMI 28.72 kg/m²  °Wt Readings from Last 3 Encounters:  °06/29/20 157 lb (71.2 kg)  °12/30/19 157 lb 9.6 oz (71.5 kg)  °11/14/19 156 lb (70.8 kg)  ° °  ° °Physical Exam °Vitals reviewed.  °Constitutional:   °   Appearance: She is well-developed.  °HENT:  °   Head: Normocephalic and atraumatic.  °   Right Ear: External ear normal.  °   Left Ear: External ear normal.  °   Nose: Nose normal.  °Eyes:  °   General: No scleral icterus. °   Conjunctiva/sclera: Conjunctivae normal.  °Neck:  °   Thyroid: No thyromegaly.  °Cardiovascular:  °   Rate and Rhythm: Normal rate and regular rhythm.  °   Heart sounds: Normal heart sounds.  °Pulmonary:  °   Effort: Pulmonary effort is normal.  °   Breath sounds: Normal breath sounds.  °Abdominal:  °   Palpations: Abdomen is soft.  °Skin: °   General: Skin is warm and dry.  °Neurological:  °   General: No focal deficit present.  °   Mental Status: She is alert and oriented to person, place, and time.  °Psychiatric:     °   Mood and Affect: Mood normal.     °   Behavior: Behavior normal.     °   Thought Content: Thought content normal.     °   Judgment: Judgment normal.  ° °  ° °General Appearance:     °Well developed, well nourished female. Alert, cooperative, in no acute distress, appears stated age   °Head:    Normocephalic, without obvious abnormality, atraumatic  °Eyes:    PERRL, conjunctiva/corneas clear, EOM's intact, fundi  °  benign, both eyes  °Ears:      Normal TM's and  external ear canals, both ears  °Nose:   Nares normal, septum midline, mucosa normal, no drainage  °  or sinus tenderness  °Throat:   Lips, mucosa, and tongue normal; teeth and gums normal  °Neck:   Supple, symmetrical, trachea midline, no adenopathy;  °  thyroid:  no enlargement/tenderness/nodules; no carotid °  bruit or JVD  °Back:     Symmetric, no curvature, ROM normal, no CVA tenderness  °Lungs:     Clear to auscultation bilaterally, respirations unlabored  °Chest Wall:    No tenderness or deformity  ° Heart:    Normal heart rate. Normal rhythm. No murmurs, rubs, or gallops.   °Breast Exam:    deferred  °Abdomen:     Soft, non-tender, bowel sounds active all four quadrants,  °  no masses, no organomegaly  °Pelvic:    deferred  °Extremities:   All extremities are intact. No cyanosis or edema  °Pulses:   2+ and symmetric all extremities  °Skin:   Skin color, texture, turgor normal, no rashes or lesions  °Lymph nodes:   Cervical, supraclavicular, and axillary nodes normal  °Neurologic:   CNII-XII intact, normal strength, sensation and reflexes  °  throughout  ° ° ° °Last depression screening scores °PHQ 2/9 Scores 06/29/2020 04/24/2018 04/04/2017  °PHQ - 2 Score 0 0 0  °PHQ- 9 Score 0 0 0  ° °Last fall risk screening °Fall Risk  04/04/2017  °Falls in the past year? No  ° °Last Audit-C alcohol use screening °Alcohol Use Disorder Test (AUDIT) 06/29/2020  °1. How often do you have a drink containing alcohol? 0  °2. How many drinks containing alcohol do you have on a typical day when you are drinking? 0  °3. How often do you have six or more drinks on one occasion? 0  °AUDIT-C Score 0  °Alcohol Brief Interventions/Follow-up AUDIT Score <7 follow-up not indicated  ° °A score of 3 or more in women, and 4 or more in men indicates increased risk for alcohol abuse, EXCEPT if all of the points are from question 1  ° °Results for orders placed or performed in visit on 06/29/20  °POCT urinalysis dipstick  °Result Value Ref Range  °  Color, UA Yellow   ° Clarity, UA Clear   ° Glucose, UA Negative Negative  ° Bilirubin, UA Negative   ° Ketones, UA Negative   ° Spec Grav, UA 1.010 1.010 - 1.025  ° Blood, UA Negative   ° pH, UA 6.0 5.0 - 8.0  ° Protein, UA Negative Negative  ° Urobilinogen, UA 0.2 0.2 or 1.0 E.U./dL  ° Nitrite, UA Negative   ° Leukocytes, UA Negative Negative  ° Appearance Normal   ° Odor Normal   ° ° Assessment & Plan  °  °Routine Health Maintenance and Physical Exam ° °Exercise Activities and Dietary recommendations °Goals   °None °  ° ° °Immunization History  °Administered Date(s) Administered  °• Influenza,inj,Quad PF,6+ Mos 01/04/2018  °• Tdap 02/08/2012  ° ° °Health Maintenance  °Topic Date Due  °• COVID-19 Vaccine (1) Never done  °• HIV Screening  Never done  °• INFLUENZA VACCINE  06/16/2021 (Originally 05/17/2020)  °• MAMMOGRAM  11/20/2021  °• TETANUS/TDAP  02/07/2022  °• PAP SMEAR-Modifier  04/04/2022  °• COLONOSCOPY  03/10/2024  °• Hepatitis C Screening  Completed  ° ° °Discussed health benefits of physical activity, and encouraged her to engage in regular exercise appropriate for her age and   condition.  1. Annual physical exam  - CBC w/Diff/Platelet - Comprehensive Metabolic Panel (CMET) - Lipid panel - TSH - POCT urinalysis dipstick  2. Screening for blood or protein in urine  - POCT urinalysis dipstick--Normal   Return in about 1 year (around 06/29/2021) for CPE.     I, Wilhemena Durie, MD, have reviewed all documentation for this visit. The documentation on 07/04/20 for the exam, diagnosis, procedures, and orders are all accurate and complete.    Tinita Brooker Cranford Mon, MD  [facility] [phone] (phone) [phone] (fax)  [place]

## 2020-06-29 ENCOUNTER — Other Ambulatory Visit: Payer: Self-pay

## 2020-06-29 ENCOUNTER — Encounter: Payer: Self-pay | Admitting: Family Medicine

## 2020-06-29 ENCOUNTER — Ambulatory Visit (INDEPENDENT_AMBULATORY_CARE_PROVIDER_SITE_OTHER): Payer: 59 | Admitting: Family Medicine

## 2020-06-29 VITALS — BP 120/83 | HR 90 | Temp 98.6°F | Resp 16 | Ht 62.0 in | Wt 157.0 lb

## 2020-06-29 DIAGNOSIS — Z0000 Encounter for general adult medical examination without abnormal findings: Secondary | ICD-10-CM | POA: Diagnosis not present

## 2020-06-29 DIAGNOSIS — Z1389 Encounter for screening for other disorder: Secondary | ICD-10-CM

## 2020-06-29 LAB — POCT URINALYSIS DIPSTICK
Appearance: NORMAL
Bilirubin, UA: NEGATIVE
Blood, UA: NEGATIVE
Glucose, UA: NEGATIVE
Ketones, UA: NEGATIVE
Leukocytes, UA: NEGATIVE
Nitrite, UA: NEGATIVE
Odor: NORMAL
Protein, UA: NEGATIVE
Spec Grav, UA: 1.01 (ref 1.010–1.025)
Urobilinogen, UA: 0.2 E.U./dL
pH, UA: 6 (ref 5.0–8.0)

## 2020-07-07 ENCOUNTER — Telehealth: Payer: Self-pay

## 2020-07-07 LAB — CBC WITH DIFFERENTIAL/PLATELET
Basophils Absolute: 0.1 10*3/uL (ref 0.0–0.2)
Basos: 2 %
EOS (ABSOLUTE): 0.2 10*3/uL (ref 0.0–0.4)
Eos: 3 %
Hematocrit: 41.7 % (ref 34.0–46.6)
Hemoglobin: 14.3 g/dL (ref 11.1–15.9)
Immature Grans (Abs): 0 10*3/uL (ref 0.0–0.1)
Immature Granulocytes: 0 %
Lymphocytes Absolute: 2 10*3/uL (ref 0.7–3.1)
Lymphs: 33 %
MCH: 30.2 pg (ref 26.6–33.0)
MCHC: 34.3 g/dL (ref 31.5–35.7)
MCV: 88 fL (ref 79–97)
Monocytes Absolute: 0.3 10*3/uL (ref 0.1–0.9)
Monocytes: 5 %
Neutrophils Absolute: 3.5 10*3/uL (ref 1.4–7.0)
Neutrophils: 57 %
Platelets: 248 10*3/uL (ref 150–450)
RBC: 4.74 x10E6/uL (ref 3.77–5.28)
RDW: 12.3 % (ref 11.7–15.4)
WBC: 6.1 10*3/uL (ref 3.4–10.8)

## 2020-07-07 LAB — COMPREHENSIVE METABOLIC PANEL
ALT: 25 IU/L (ref 0–32)
AST: 22 IU/L (ref 0–40)
Albumin/Globulin Ratio: 1.6 (ref 1.2–2.2)
Albumin: 4.5 g/dL (ref 3.8–4.8)
Alkaline Phosphatase: 93 IU/L (ref 44–121)
BUN/Creatinine Ratio: 17 (ref 12–28)
BUN: 11 mg/dL (ref 8–27)
Bilirubin Total: 0.4 mg/dL (ref 0.0–1.2)
CO2: 22 mmol/L (ref 20–29)
Calcium: 9.3 mg/dL (ref 8.7–10.3)
Chloride: 99 mmol/L (ref 96–106)
Creatinine, Ser: 0.65 mg/dL (ref 0.57–1.00)
GFR calc Af Amer: 111 mL/min/{1.73_m2} (ref >59)
GFR calc non Af Amer: 96 mL/min/{1.73_m2} (ref >59)
Globulin, Total: 2.9 g/dL (ref 1.5–4.5)
Glucose: 95 mg/dL (ref 65–99)
Potassium: 4.2 mmol/L (ref 3.5–5.2)
Sodium: 137 mmol/L (ref 134–144)
Total Protein: 7.4 g/dL (ref 6.0–8.5)

## 2020-07-07 LAB — TSH: TSH: 1.2 u[IU]/mL (ref 0.450–4.500)

## 2020-07-07 LAB — LIPID PANEL
Chol/HDL Ratio: 5.5 ratio — ABNORMAL HIGH (ref 0.0–4.4)
Cholesterol, Total: 240 mg/dL — ABNORMAL HIGH (ref 100–199)
HDL: 44 mg/dL (ref >39)
LDL Chol Calc (NIH): 147 mg/dL — ABNORMAL HIGH (ref 0–99)
Triglycerides: 268 mg/dL — ABNORMAL HIGH (ref 0–149)
VLDL Cholesterol Cal: 49 mg/dL — ABNORMAL HIGH (ref 5–40)

## 2020-07-07 NOTE — Telephone Encounter (Signed)
-----   Message from Jerrol Banana., MD sent at 07/07/2020  8:22 AM EDT ----- Labs stable. Continue adding a dose of Metamucil every morning to help with lipids. Continue good exercise and diet program.

## 2020-07-07 NOTE — Telephone Encounter (Signed)
Patient advised of lab results via mychart and has read the providers comments.

## 2021-01-17 ENCOUNTER — Other Ambulatory Visit: Payer: Self-pay | Admitting: Family Medicine

## 2021-01-17 DIAGNOSIS — N951 Menopausal and female climacteric states: Secondary | ICD-10-CM

## 2021-01-17 NOTE — Telephone Encounter (Signed)
Requested Prescriptions  Pending Prescriptions Disp Refills  . estradiol (ESTRACE) 0.5 MG tablet [Pharmacy Med Name: Estradiol 0.5 MG Oral Tablet] 90 tablet 1    Sig: Take 1 tablet by mouth once daily     OB/GYN:  Estrogens Passed - 01/17/2021  7:44 AM      Passed - Mammogram is up-to-date per Health Maintenance      Passed - Last BP in normal range    BP Readings from Last 1 Encounters:  06/29/20 120/83         Passed - Valid encounter within last 12 months    Recent Outpatient Visits          6 months ago Annual physical exam   [facility] Jerrol Banana., MD   1 year ago Hormone replacement therapy   [facility] Jerrol Banana., MD   2 years ago Annual physical exam   [facility] Jerrol Banana., MD   3 years ago Annual physical exam   [facility] Jerrol Banana., MD   4 years ago Annual physical exam   [facility] Jerrol Banana., MD      Future Appointments            In 5 months Jerrol Banana., MD [facility] [place]

## 2021-01-17 NOTE — Telephone Encounter (Signed)
Requested Prescriptions  Pending Prescriptions Disp Refills  . medroxyPROGESTERone (PROVERA) 2.5 MG tablet [Pharmacy Med Name: medroxyPROGESTERone Acetate 2.5 MG Oral Tablet] 30 tablet 1    Sig: Take 1 tablet by mouth once daily     OB/GYN:  Progestins Passed - 01/17/2021  8:27 AM      Passed - Valid encounter within last 12 months    Recent Outpatient Visits          6 months ago Annual physical exam   [facility] Jerrol Banana., MD   1 year ago Hormone replacement therapy   [facility] Jerrol Banana., MD   2 years ago Annual physical exam   [facility] Jerrol Banana., MD   3 years ago Annual physical exam   [facility] Jerrol Banana., MD   4 years ago Annual physical exam   [facility] Jerrol Banana., MD      Future Appointments            In 5 months Jerrol Banana., MD [facility] [place]

## 2021-02-03 ENCOUNTER — Other Ambulatory Visit: Payer: Self-pay | Admitting: Family Medicine

## 2021-02-03 DIAGNOSIS — G43101 Migraine with aura, not intractable, with status migrainosus: Secondary | ICD-10-CM

## 2021-03-11 ENCOUNTER — Other Ambulatory Visit: Payer: Self-pay | Admitting: Family Medicine

## 2021-03-11 DIAGNOSIS — N951 Menopausal and female climacteric states: Secondary | ICD-10-CM

## 2021-03-11 NOTE — Telephone Encounter (Signed)
Requested medications are due for refill today yes  Requested medications are on the active medication list yes  Last refill 4/27  Last visit 06/2020  Future visit scheduled 06/2021  Notes to clinic Was given 2 months supply in March, next visit scheduled not until 06/2021, please assess.

## 2021-04-28 ENCOUNTER — Other Ambulatory Visit: Payer: Self-pay | Admitting: Family Medicine

## 2021-04-28 DIAGNOSIS — G43101 Migraine with aura, not intractable, with status migrainosus: Secondary | ICD-10-CM

## 2021-04-28 NOTE — Telephone Encounter (Signed)
   Notes to clinic:  Verify qty for refill  Only 8 tabs   Requested Prescriptions  Pending Prescriptions Disp Refills   venlafaxine XR (EFFEXOR-XR) 75 MG 24 hr capsule [Pharmacy Med Name: Venlafaxine HCl ER 75 MG Oral Capsule Extended Release 24 Hour] 8 capsule 0    Sig: TAKE 1 CAPSULE BY MOUTH ONCE DAILY WITH BREAKFAST      Psychiatry: Antidepressants - SNRI - desvenlafaxine & venlafaxine Failed - 04/28/2021  8:25 AM      Failed - LDL in normal range and within 360 days    LDL Chol Calc (NIH)  Date Value Ref Range Status  07/06/2020 147 (H) 0 - 99 mg/dL Final          Failed - Total Cholesterol in normal range and within 360 days    Cholesterol, Total  Date Value Ref Range Status  07/06/2020 240 (H) 100 - 199 mg/dL Final          Failed - Triglycerides in normal range and within 360 days    Triglycerides  Date Value Ref Range Status  07/06/2020 268 (H) 0 - 149 mg/dL Final          Failed - Valid encounter within last 6 months    Recent Outpatient Visits           10 months ago Annual physical exam   [facility] Jerrol Banana., MD   1 year ago Hormone replacement therapy   [facility] Jerrol Banana., MD   3 years ago Annual physical exam   [facility] Jerrol Banana., MD   4 years ago Annual physical exam   [facility] Jerrol Banana., MD   5 years ago Annual physical exam   [facility] Jerrol Banana., MD       Future Appointments             In 2 months Jerrol Banana., MD [facility]             Passed - Last BP in normal range    BP Readings from Last 1 Encounters:  06/29/20 120/83

## 2021-05-03 NOTE — Telephone Encounter (Signed)
Last ov: 06/29/2020 Next OV: 07/05/2021 Last Refill: 02/03/2021 #90 with 0/refills

## 2021-05-09 ENCOUNTER — Other Ambulatory Visit: Payer: Self-pay | Admitting: Family Medicine

## 2021-05-09 DIAGNOSIS — G43101 Migraine with aura, not intractable, with status migrainosus: Secondary | ICD-10-CM

## 2021-05-09 NOTE — Telephone Encounter (Signed)
last RF 05/03/21 #90

## 2021-06-30 ENCOUNTER — Encounter: Payer: Self-pay | Admitting: Family Medicine

## 2021-07-05 ENCOUNTER — Encounter: Payer: Self-pay | Admitting: Family Medicine

## 2021-07-05 ENCOUNTER — Other Ambulatory Visit (HOSPITAL_COMMUNITY)
Admission: RE | Admit: 2021-07-05 | Discharge: 2021-07-05 | Disposition: A | Discharge status: Home / Self Care | Payer: BC Managed Care – PPO | Source: Ambulatory Visit | Attending: Family Medicine | Admitting: Family Medicine

## 2021-07-05 ENCOUNTER — Other Ambulatory Visit: Payer: Self-pay

## 2021-07-05 ENCOUNTER — Ambulatory Visit (INDEPENDENT_AMBULATORY_CARE_PROVIDER_SITE_OTHER): Payer: BC Managed Care – PPO | Admitting: Family Medicine

## 2021-07-05 VITALS — BP 129/83 | HR 86 | Temp 98.5°F | Resp 16 | Ht 62.0 in | Wt 158.0 lb

## 2021-07-05 DIAGNOSIS — Z136 Encounter for screening for cardiovascular disorders: Secondary | ICD-10-CM | POA: Diagnosis not present

## 2021-07-05 DIAGNOSIS — Z13220 Encounter for screening for lipoid disorders: Secondary | ICD-10-CM

## 2021-07-05 DIAGNOSIS — Z0000 Encounter for general adult medical examination without abnormal findings: Secondary | ICD-10-CM | POA: Diagnosis not present

## 2021-07-05 DIAGNOSIS — L989 Disorder of the skin and subcutaneous tissue, unspecified: Secondary | ICD-10-CM

## 2021-07-05 DIAGNOSIS — Z124 Encounter for screening for malignant neoplasm of cervix: Secondary | ICD-10-CM | POA: Diagnosis not present

## 2021-07-05 DIAGNOSIS — N951 Menopausal and female climacteric states: Secondary | ICD-10-CM | POA: Diagnosis not present

## 2021-07-05 DIAGNOSIS — Z130 Encounter for screening for diseases of the blood and blood-forming organs and certain disorders involving the immune mechanism: Secondary | ICD-10-CM

## 2021-07-05 DIAGNOSIS — Z23 Encounter for immunization: Secondary | ICD-10-CM | POA: Diagnosis not present

## 2021-07-05 DIAGNOSIS — Z1329 Encounter for screening for other suspected endocrine disorder: Secondary | ICD-10-CM

## 2021-07-05 NOTE — Progress Notes (Signed)
I,April Miller,acting as a scribe for Wilhemena Durie, MD.,have documented all relevant documentation on the behalf of Wilhemena Durie, MD,as directed by  Wilhemena Durie, MD while in the presence of Wilhemena Durie, MD.   Complete physical exam   Patient: Michelle Bradley   DOB: [birth year]   60 y.o. Female  MRN: [id number] Visit Date: 07/05/2021  Today's healthcare provider: Wilhemena Durie, MD   Chief Complaint  Patient presents with   Annual Exam   Subjective    Michelle Bradley is a 60 y.o. female who presents today for a complete physical exam.  She reports consuming a general diet. Gym/ health club routine includes workout. She generally feels fairly well. She reports sleeping well. She does not have additional problems to discuss today.  Patient is a married female with 2 children and 1 grandchild.  Patient is a married female with 2 children and 1 grandchild.  She does not do any regular exercise. 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 [place]  Tobacco Use   Smoking status: Never   Smokeless tobacco: Never  Vaping Use   Vaping Use: Never used  Substance and Sexual Activity   Alcohol use: No   Drug use: No   Sexual activity: Yes    Birth control/protection: None  Other Topics Concern   Not on file  Social History Narrative   Not on file   Social Determinants of Health   Financial Resource Strain: Not on file  Food Insecurity: Not on  file  Transportation Needs: Not on file  Physical Activity: Not on file  Stress: Not on file  Social Connections: Not on file  Intimate Partner Violence: Not on file   Family Status  Relation Name Status   Mother  Deceased at age 57       leukemia   Father  Deceased at age 76       bone cancer   Sister  Alive   Brother  Alive   Daughter  Alive   Son  Alive   Brother  Alive   Psychiatrist  (Not Specified)   Neg Hx  (Not Specified)   Family History  Problem Relation Age of Onset   Leukemia Mother    Multiple myeloma Father    Multiple sclerosis Brother    Crohn's disease Brother    Bone cancer Paternal Uncle    Multiple myeloma Paternal Uncle    Breast cancer Neg Hx    No Known Allergies  Patient Care Team: Jerrol Banana., MD as PCP - General (Family Medicine)   Medications: Outpatient Medications Prior to Visit  Medication Sig   estradiol (ESTRACE) 0.5 MG tablet Take 1 tablet by mouth once daily   loratadine (CLARITIN) 10 MG tablet Take 10 mg by mouth daily.  medroxyPROGESTERone (PROVERA) 2.5 MG tablet Take 1 tablet by mouth once daily   Multiple Vitamin (MULTIVITAMINS PO) Take 1 tablet by mouth daily.   Turmeric Curcumin 500 MG CAPS Take 400 mg by mouth once.   venlafaxine XR (EFFEXOR-XR) 75 MG 24 hr capsule TAKE 1 CAPSULE BY MOUTH ONCE DAILY WITH BREAKFAST   No facility-administered medications prior to visit.    Review of Systems  HENT:  Positive for sinus pressure.   All other systems reviewed and are negative.     Objective    BP 129/83 (BP Location: Right Arm, Patient Position: Sitting, Cuff Size: Large)   Pulse 86   Temp 98.5 F (36.9 C) (Oral)   Resp 16   Ht _0  (1.575 m)   Wt 158 lb (71.7 kg)   SpO2 95%   BMI 28.90 kg/m  BP Readings from Last 3 Encounters:  07/05/21 129/83  06/29/20 120/83  12/30/19 122/78   Wt Readings from Last 3 Encounters:  07/05/21 158 lb (71.7 kg)  06/29/20 157 lb (71.2 kg)  12/30/19 157 lb 9.6 oz (71.5  kg)      Physical Exam Vitals reviewed.  Constitutional:      Appearance: She is well-developed.  HENT:     Head: Normocephalic and atraumatic.     Right Ear: External ear normal.     Left Ear: External ear normal.     Nose: Nose normal.  Eyes:     General: No scleral icterus.    Conjunctiva/sclera: Conjunctivae normal.  Neck:     Thyroid: No thyromegaly.  Cardiovascular:     Rate and Rhythm: Normal rate and regular rhythm.     Heart sounds: Normal heart sounds.  Pulmonary:     Effort: Pulmonary effort is normal.     Breath sounds: Normal breath sounds.  Chest:  Breasts:    Right: Normal.     Left: Normal.  Abdominal:     Palpations: Abdomen is soft.  Genitourinary:    General: Normal vulva.     Vagina: No vaginal discharge.     Rectum: Normal.     Comments: Bimanual exam benign. Skin:    General: Skin is warm and dry.  Neurological:     General: No focal deficit present.     Mental Status: She is alert and oriented to person, place, and time.  Psychiatric:        Mood and Affect: Mood normal.        Behavior: Behavior normal.        Thought Content: Thought content normal.        Judgment: Judgment normal.      Last depression screening scores PHQ 2/9 Scores 07/05/2021 06/29/2020 04/24/2018  PHQ - 2 Score 0 0 0  PHQ- 9 Score 0 0 0   Last fall risk screening Fall Risk  07/05/2021  Falls in the past year? 0  Number falls in past yr: 0  Injury with Fall? 0  Risk for fall due to : No Fall Risks  Follow up Falls evaluation completed   Last Audit-C alcohol use screening Alcohol Use Disorder Test (AUDIT) 07/05/2021  1. How often do you have a drink containing alcohol? 0  2. How many drinks containing alcohol do you have on a typical day when you are drinking? 0  3. How often do you have six or more drinks on one occasion? 0  AUDIT-C Score 0  Alcohol Brief Interventions/Follow-up -   A score of 3  or more in women, and 4 or more in men indicates increased risk  for alcohol abuse, EXCEPT if all of the points are from question 1   No results found for any visits on 07/05/21.  Assessment & Plan    Routine Health Maintenance and Physical Exam  Exercise Activities and Dietary recommendations  Goals   None     Immunization History  Administered Date(s) Administered   Influenza,inj,Quad PF,6+ Mos 01/04/2018, 07/05/2021   Tdap 02/08/2012    Health Maintenance  Topic Date Due   COVID-19 Vaccine (1) Never done   HIV Screening  Never done   Zoster Vaccines- Shingrix (1 of 2) Never done   MAMMOGRAM  11/20/2021   TETANUS/TDAP  02/07/2022   PAP SMEAR-Modifier  04/04/2022   COLONOSCOPY (Pts 45-64yr Insurance coverage will need to be confirmed)  03/10/2024   INFLUENZA VACCINE  Completed   Hepatitis C Screening  Completed   HPV VACCINES  Aged Out    Discussed health benefits of physical activity, and encouraged her to engage in regular exercise appropriate for her age and condition.  1. Annual physical exam What should be last Pap smear obtained today. - Lipid panel - TSH - CBC w/Diff/Platelet - Comprehensive Metabolic Panel (CMET)  2. Menopausal symptom Well controlled with HRT. - Lipid panel - TSH - CBC w/Diff/Platelet - Comprehensive Metabolic Panel (CMET)  3. Hot flash, menopausal  - Lipid panel - TSH - CBC w/Diff/Platelet - Comprehensive Metabolic Panel (CMET)  4. Screening for cardiovascular condition  - Lipid panel - TSH - CBC w/Diff/Platelet - Comprehensive Metabolic Panel (CMET)  5. Encounter for screening for hematologic disorder  - Lipid panel - TSH - CBC w/Diff/Platelet - Comprehensive Metabolic Panel (CMET)  6. Screening for thyroid disorder  - Lipid panel - TSH - CBC w/Diff/Platelet - Comprehensive Metabolic Panel (CMET)  7. Screening for lipoid disorders  - Lipid panel - TSH - CBC w/Diff/Platelet - Comprehensive Metabolic Panel (CMET)  8. Facial lesion  - Ambulatory referral to  Dermatology  9. Need for influenza vaccination  - Flu Vaccine QUAD 622moM (Fluarix, Fluzone & Alfiuria Quad PF)  10. Screening for cervical cancer Should be last Pap smear for patient - Cytology - PAP    Return in about 18 weeks (around 11/08/2021).     I, RiWilhemena DurieMD, have reviewed all documentation for this visit. The documentation on 07/08/21 for the exam, diagnosis, procedures, and orders are all accurate and complete.    Corky Blumstein GiCranford MonMD  [phone]) [phone])  [place]

## 2021-07-06 ENCOUNTER — Telehealth: Payer: Self-pay

## 2021-07-06 LAB — CBC WITH DIFFERENTIAL/PLATELET
Basophils Absolute: 0.1 10*3/uL (ref 0.0–0.2)
Basos: 1 %
EOS (ABSOLUTE): 0.2 10*3/uL (ref 0.0–0.4)
Eos: 2 %
Hematocrit: 42.8 % (ref 34.0–46.6)
Hemoglobin: 14.4 g/dL (ref 11.1–15.9)
Immature Grans (Abs): 0 10*3/uL (ref 0.0–0.1)
Immature Granulocytes: 0 %
Lymphocytes Absolute: 2.5 10*3/uL (ref 0.7–3.1)
Lymphs: 33 %
MCH: 29.5 pg (ref 26.6–33.0)
MCHC: 33.6 g/dL (ref 31.5–35.7)
MCV: 88 fL (ref 79–97)
Monocytes Absolute: 0.4 10*3/uL (ref 0.1–0.9)
Monocytes: 6 %
Neutrophils Absolute: 4.4 10*3/uL (ref 1.4–7.0)
Neutrophils: 58 %
Platelets: 267 10*3/uL (ref 150–450)
RBC: 4.88 x10E6/uL (ref 3.77–5.28)
RDW: 11.9 % (ref 11.7–15.4)
WBC: 7.6 10*3/uL (ref 3.4–10.8)

## 2021-07-06 LAB — COMPREHENSIVE METABOLIC PANEL
ALT: 19 IU/L (ref 0–32)
AST: 20 IU/L (ref 0–40)
Albumin/Globulin Ratio: 1.5 (ref 1.2–2.2)
Albumin: 4.6 g/dL (ref 3.8–4.8)
Alkaline Phosphatase: 98 IU/L (ref 44–121)
BUN/Creatinine Ratio: 22 (ref 12–28)
BUN: 14 mg/dL (ref 8–27)
Bilirubin Total: 0.3 mg/dL (ref 0.0–1.2)
CO2: 23 mmol/L (ref 20–29)
Calcium: 9.6 mg/dL (ref 8.7–10.3)
Chloride: 98 mmol/L (ref 96–106)
Creatinine, Ser: 0.65 mg/dL (ref 0.57–1.00)
Globulin, Total: 3 g/dL (ref 1.5–4.5)
Glucose: 96 mg/dL (ref 65–99)
Potassium: 4.9 mmol/L (ref 3.5–5.2)
Sodium: 142 mmol/L (ref 134–144)
Total Protein: 7.6 g/dL (ref 6.0–8.5)
eGFR: 99 mL/min/{1.73_m2} (ref >59)

## 2021-07-06 LAB — CYTOLOGY - PAP
Comment: NEGATIVE
Diagnosis: NEGATIVE
High risk HPV: NEGATIVE

## 2021-07-06 LAB — LIPID PANEL
Chol/HDL Ratio: 5.4 ratio — ABNORMAL HIGH (ref 0.0–4.4)
Cholesterol, Total: 271 mg/dL — ABNORMAL HIGH (ref 100–199)
HDL: 50 mg/dL (ref >39)
LDL Chol Calc (NIH): 180 mg/dL — ABNORMAL HIGH (ref 0–99)
Triglycerides: 217 mg/dL — ABNORMAL HIGH (ref 0–149)
VLDL Cholesterol Cal: 41 mg/dL — ABNORMAL HIGH (ref 5–40)

## 2021-07-06 LAB — TSH: TSH: 1.45 u[IU]/mL (ref 0.450–4.500)

## 2021-07-06 NOTE — Telephone Encounter (Signed)
Copied from [place] [phone] [id number]. Topic: General - Other >> Jul 06, 2021  3:22 PM Yvette Rack wrote: Reason for CRM: Pt requests call back to go over lab results. Cb# [phone]

## 2021-07-06 NOTE — Telephone Encounter (Signed)
Advised patient of lab results. KW

## 2021-07-07 ENCOUNTER — Other Ambulatory Visit: Payer: Self-pay | Admitting: Family Medicine

## 2021-07-07 DIAGNOSIS — N951 Menopausal and female climacteric states: Secondary | ICD-10-CM

## 2021-07-20 ENCOUNTER — Other Ambulatory Visit: Payer: Self-pay | Admitting: Family Medicine

## 2021-07-20 DIAGNOSIS — Z1231 Encounter for screening mammogram for malignant neoplasm of breast: Secondary | ICD-10-CM

## 2021-08-02 ENCOUNTER — Other Ambulatory Visit: Payer: Self-pay | Admitting: Family Medicine

## 2021-08-02 DIAGNOSIS — G43101 Migraine with aura, not intractable, with status migrainosus: Secondary | ICD-10-CM

## 2021-08-03 ENCOUNTER — Other Ambulatory Visit: Payer: Self-pay | Admitting: Family Medicine

## 2021-08-03 DIAGNOSIS — G43101 Migraine with aura, not intractable, with status migrainosus: Secondary | ICD-10-CM

## 2021-08-03 NOTE — Telephone Encounter (Signed)
Requested medications are due for refill today yes  Requested medications are on the active medication list yes  Last refill 05/03/21  Last visit 07/05/21  Future visit scheduled 11/15/20  Notes to clinic Failed protocol due to lipid labs elevated, not on a lipid medication, please assess.  Requested Prescriptions  Pending Prescriptions Disp Refills   venlafaxine XR (EFFEXOR-XR) 75 MG 24 hr capsule [Pharmacy Med Name: Venlafaxine HCl ER 75 MG Oral Capsule Extended Release 24 Hour] 90 capsule 0    Sig: TAKE 1 CAPSULE BY MOUTH ONCE DAILY WITH BREAKFAST     Psychiatry: Antidepressants - SNRI - desvenlafaxine & venlafaxine Failed - 08/02/2021  8:44 PM      Failed - LDL in normal range and within 360 days    LDL Chol Calc (NIH)  Date Value Ref Range Status  07/05/2021 180 (H) 0 - 99 mg/dL Final          Failed - Total Cholesterol in normal range and within 360 days    Cholesterol, Total  Date Value Ref Range Status  07/05/2021 271 (H) 100 - 199 mg/dL Final          Failed - Triglycerides in normal range and within 360 days    Triglycerides  Date Value Ref Range Status  07/05/2021 217 (H) 0 - 149 mg/dL Final          Passed - Last BP in normal range    BP Readings from Last 1 Encounters:  07/05/21 129/83          Passed - Valid encounter within last 6 months    Recent Outpatient Visits           4 weeks ago Annual physical exam   [facility] Jerrol Banana., MD   1 year ago Annual physical exam   [facility] Jerrol Banana., MD   1 year ago Hormone replacement therapy   [facility] Jerrol Banana., MD   3 years ago Annual physical exam   [facility] Jerrol Banana., MD   4 years ago Annual physical exam   [facility] Jerrol Banana., MD       Future Appointments             In 2 months Ralene Bathe, MD [place]   In 3 months Jerrol Banana., MD [facility]

## 2021-08-06 ENCOUNTER — Other Ambulatory Visit: Payer: Self-pay | Admitting: Family Medicine

## 2021-08-06 DIAGNOSIS — N951 Menopausal and female climacteric states: Secondary | ICD-10-CM

## 2021-08-10 ENCOUNTER — Other Ambulatory Visit: Payer: Self-pay

## 2021-08-10 ENCOUNTER — Ambulatory Visit
Admission: RE | Admit: 2021-08-10 | Discharge: 2021-08-10 | Disposition: A | Discharge status: Home / Self Care | Payer: BC Managed Care – PPO | Source: Ambulatory Visit | Attending: Family Medicine | Admitting: Family Medicine

## 2021-08-10 DIAGNOSIS — Z1231 Encounter for screening mammogram for malignant neoplasm of breast: Secondary | ICD-10-CM | POA: Diagnosis not present

## 2021-10-04 ENCOUNTER — Ambulatory Visit: Payer: BC Managed Care – PPO | Admitting: Dermatology

## 2021-10-05 ENCOUNTER — Other Ambulatory Visit: Payer: Self-pay | Admitting: Family Medicine

## 2021-10-05 DIAGNOSIS — N951 Menopausal and female climacteric states: Secondary | ICD-10-CM

## 2021-10-05 NOTE — Telephone Encounter (Signed)
Requested Prescriptions  Pending Prescriptions Disp Refills   estradiol (ESTRACE) 0.5 MG tablet [Pharmacy Med Name: Estradiol 0.5 MG Oral Tablet] 90 tablet 0    Sig: Take 1 tablet by mouth once daily     OB/GYN:  Estrogens Passed - 10/05/2021  2:32 AM      Passed - Mammogram is up-to-date per Health Maintenance      Passed - Last BP in normal range    BP Readings from Last 1 Encounters:  07/05/21 129/83         Passed - Valid encounter within last 12 months    Recent Outpatient Visits          3 months ago Annual physical exam   [facility] Jerrol Banana., MD   1 year ago Annual physical exam   [facility] Jerrol Banana., MD   1 year ago Hormone replacement therapy   [facility] Jerrol Banana., MD   3 years ago Annual physical exam   [facility] Jerrol Banana., MD   4 years ago Annual physical exam   [facility] Jerrol Banana., MD      Future Appointments            In 1 month Jerrol Banana., MD [facility], [place]

## 2021-11-15 ENCOUNTER — Ambulatory Visit: Payer: BC Managed Care – PPO | Admitting: Family Medicine

## 2021-11-18 IMAGING — MG DIGITAL DIAGNOSTIC BILAT W/ TOMO W/ CAD
6 of 10 series · 6 of 30 positions shown · non-contrast
Comparison: Previous exam(s).

CLINICAL DATA: 58-year-old female with palpable mass in the LEFT
breast discovered on self-examination. Also for annual bilateral
mammogram.

EXAM:
DIGITAL DIAGNOSTIC BILATERAL MAMMOGRAM WITH CAD AND TOMO
ULTRASOUND LEFT BREAST

[L MLO synth-2D]
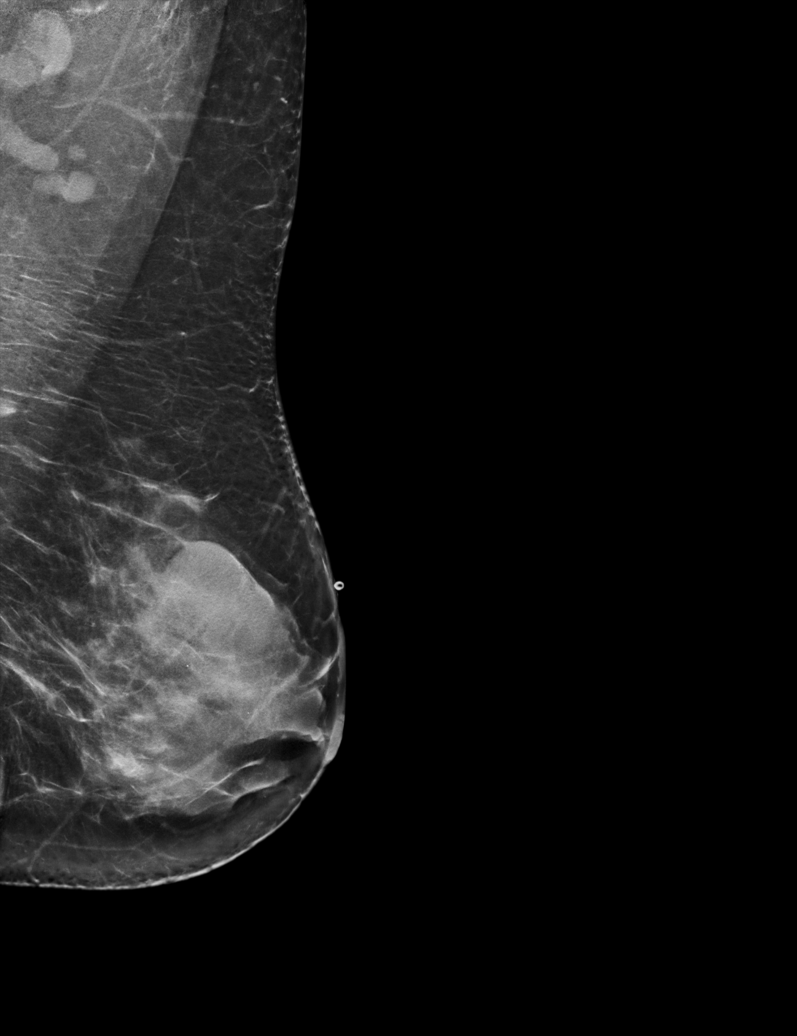

[R CC synth-2D]
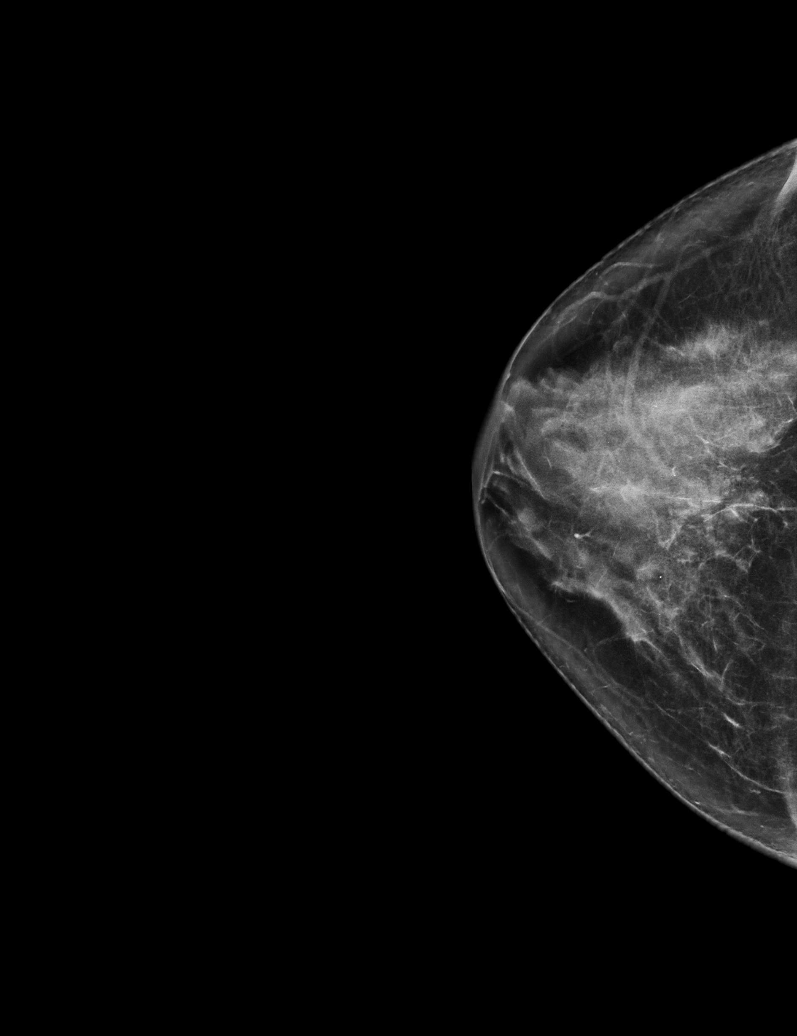

[R MLO synth-2D]
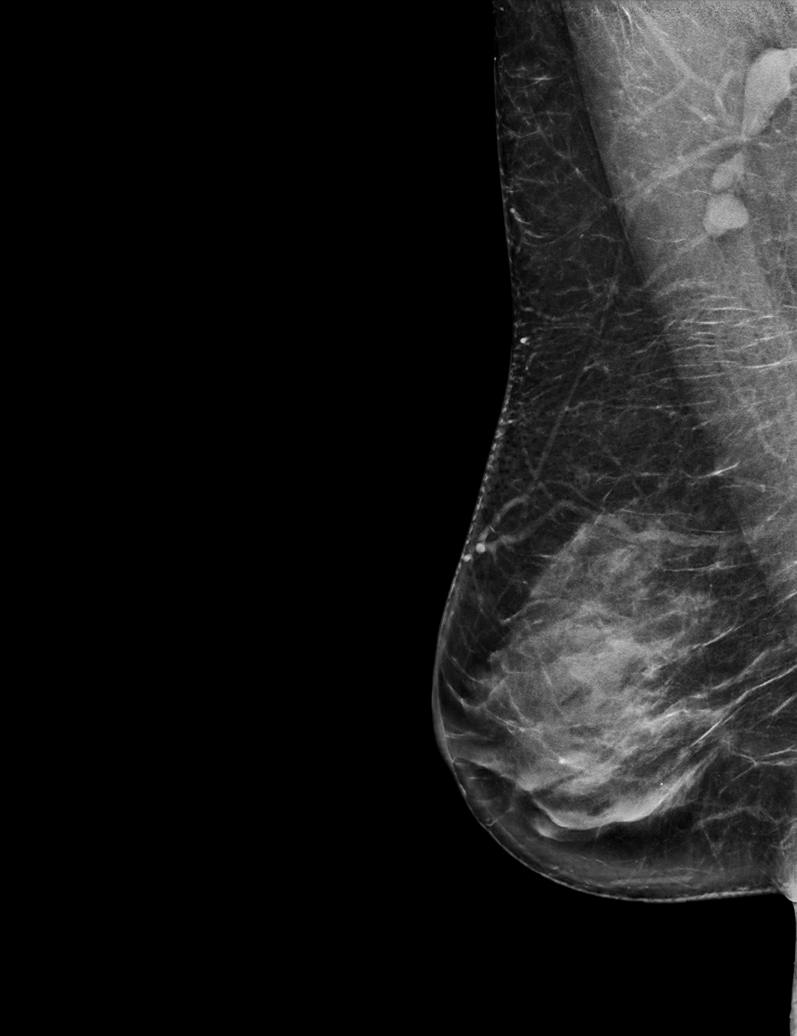

[L TAN synth-2D]
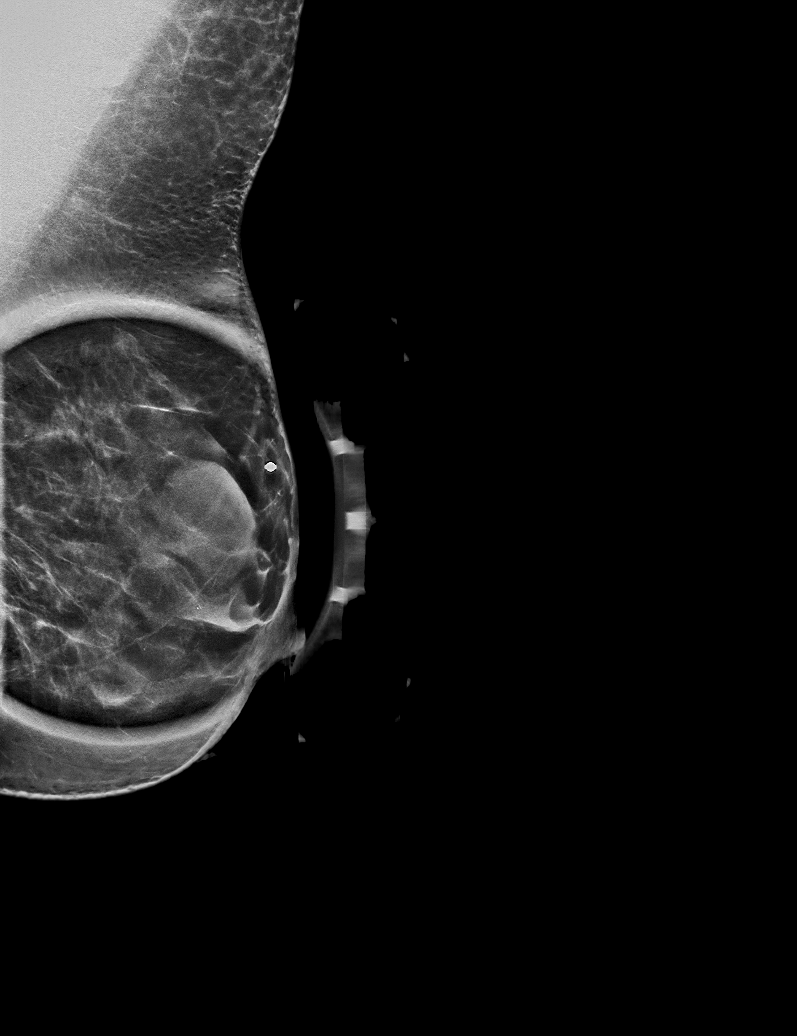

[L CC synth-2D]
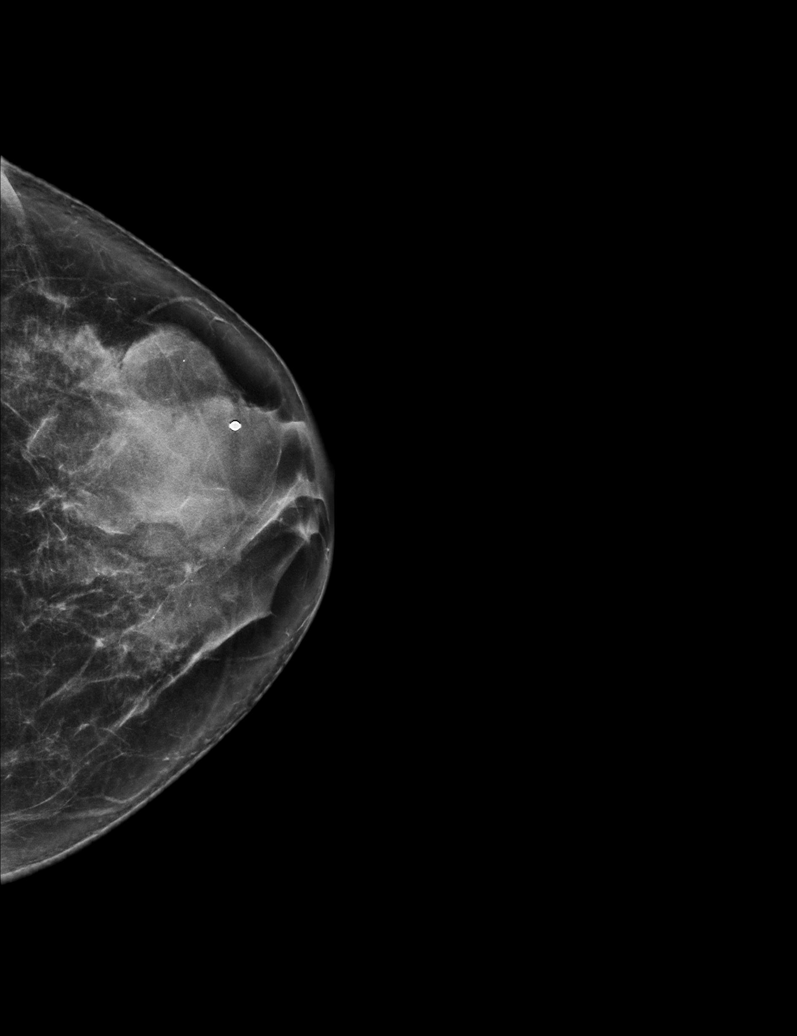

[R MLO tomo · tomo slice 36/71.0]
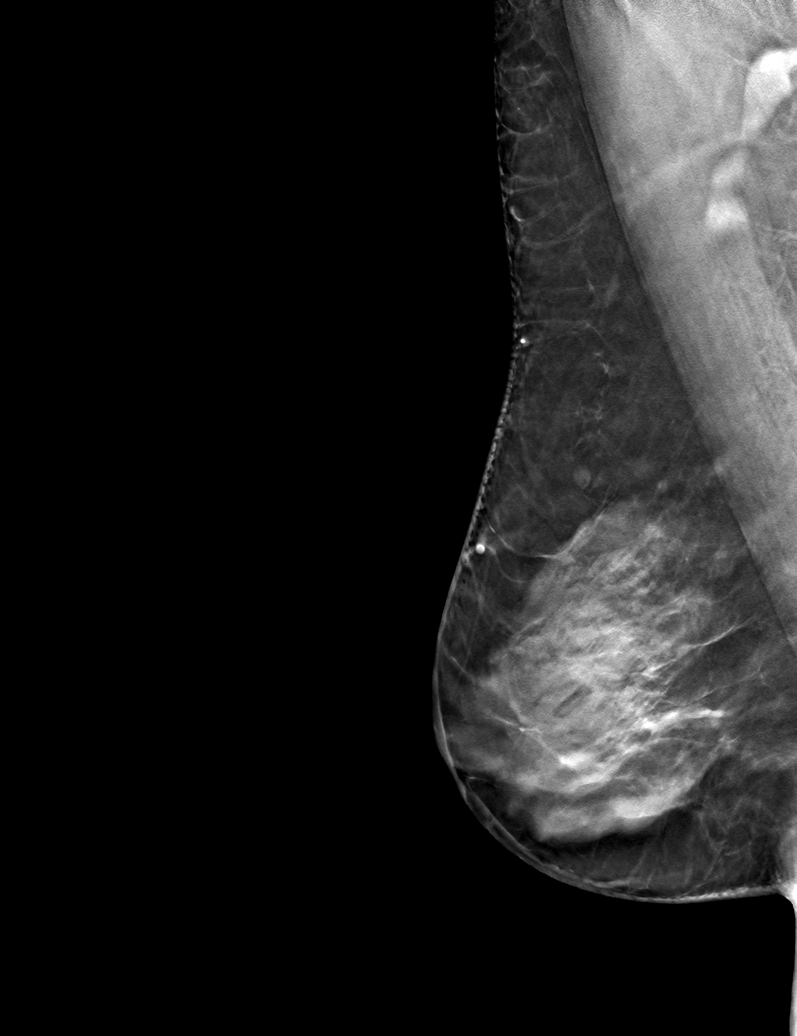

[6 of 30 positions shown; findings below may reference images not displayed]

ACR Breast Density Category c: The breast tissue is heterogeneously
dense, which may obscure small masses.
FINDINGS: 2D/3D full field views of both breasts and spot compression view of
the LEFT breast demonstrate a circumscribed oval mass within the
RETROAREOLAR LEFT breast.

No other mass, distortion or worrisome calcifications are noted
within either breast.

Mammographic images were processed with CAD.

On physical exam, a firm palpable mobile mass is identified within
the UPPER-OUTER RETROAREOLAR LEFT breast.

Targeted ultrasound is performed, showing 2 adjacent complicated
cysts with internal mobile echoes at the 12 30 to 1 o'clock position
of the RETROAREOLAR LEFT breast. The cysts measure 2.1 x 1.5 x
cm and 1.9 x 1.7 x 1.8 cm. The cysts correspond to the patient's
palpable abnormality.
IMPRESSION: 1. Two adjacent benign complicated cysts within the UPPER-OUTER
RETROAREOLAR LEFT breast corresponding to the patient's palpable
abnormality.
2. No suspicious mammographic findings.

RECOMMENDATION:
Bilateral screening mammogram in 1 year.

I have discussed the findings and recommendations with the patient.
If applicable, a reminder letter will be sent to the patient
regarding the next appointment.

BI-RADS CATEGORY  2: Benign.

## 2022-01-03 ENCOUNTER — Other Ambulatory Visit: Payer: Self-pay | Admitting: Family Medicine

## 2022-01-03 DIAGNOSIS — N951 Menopausal and female climacteric states: Secondary | ICD-10-CM

## 2022-03-29 ENCOUNTER — Other Ambulatory Visit: Payer: Self-pay | Admitting: Family Medicine

## 2022-03-29 DIAGNOSIS — N951 Menopausal and female climacteric states: Secondary | ICD-10-CM

## 2022-06-27 ENCOUNTER — Other Ambulatory Visit: Payer: Self-pay | Admitting: Family Medicine

## 2022-06-27 DIAGNOSIS — N951 Menopausal and female climacteric states: Secondary | ICD-10-CM

## 2022-06-30 ENCOUNTER — Other Ambulatory Visit: Payer: Self-pay | Admitting: Family Medicine

## 2022-06-30 DIAGNOSIS — Z1231 Encounter for screening mammogram for malignant neoplasm of breast: Secondary | ICD-10-CM

## 2022-09-27 ENCOUNTER — Ambulatory Visit: Payer: Self-pay | Admitting: Dermatology

## 2022-09-28 ENCOUNTER — Ambulatory Visit (INDEPENDENT_AMBULATORY_CARE_PROVIDER_SITE_OTHER): Payer: 59 | Admitting: Dermatology

## 2022-09-28 VITALS — BP 132/81 | HR 103

## 2022-09-28 DIAGNOSIS — L578 Other skin changes due to chronic exposure to nonionizing radiation: Secondary | ICD-10-CM

## 2022-09-28 DIAGNOSIS — L814 Other melanin hyperpigmentation: Secondary | ICD-10-CM | POA: Diagnosis not present

## 2022-09-28 DIAGNOSIS — L821 Other seborrheic keratosis: Secondary | ICD-10-CM

## 2022-09-28 DIAGNOSIS — L304 Erythema intertrigo: Secondary | ICD-10-CM | POA: Diagnosis not present

## 2022-09-28 DIAGNOSIS — Z1283 Encounter for screening for malignant neoplasm of skin: Secondary | ICD-10-CM | POA: Diagnosis not present

## 2022-09-28 DIAGNOSIS — D1801 Hemangioma of skin and subcutaneous tissue: Secondary | ICD-10-CM | POA: Diagnosis not present

## 2022-09-28 DIAGNOSIS — D2372 Other benign neoplasm of skin of left lower limb, including hip: Secondary | ICD-10-CM

## 2022-09-28 DIAGNOSIS — D2371 Other benign neoplasm of skin of right lower limb, including hip: Secondary | ICD-10-CM

## 2022-09-28 MED ORDER — KETOCONAZOLE 2 % EX CREA: 1.0000 | TOPICAL_CREAM | CUTANEOUS | 4 refills | Status: AC

## 2022-09-28 NOTE — Patient Instructions (Addendum)
Seborrheic Keratosis  What causes seborrheic keratoses? Seborrheic keratoses are harmless, common skin growths that first appear during adult life.  As time goes by, more growths appear.  Some people may develop a large number of them.  Seborrheic keratoses appear on both covered and uncovered body parts.  They are not caused by sunlight.  The tendency to develop seborrheic keratoses can be inherited.  They vary in color from skin-colored to gray, brown, or even black.  They can be either smooth or have a rough, warty surface.   Seborrheic keratoses are superficial and look as if they were stuck on the skin.  Under the microscope this type of keratosis looks like layers upon layers of skin.  That is why at times the top layer may seem to fall off, but the rest of the growth remains and re-grows.    Treatment Seborrheic keratoses do not need to be treated, but can easily be removed in the office.  Seborrheic keratoses often cause symptoms when they rub on clothing or jewelry.  Lesions can be in the way of shaving.  If they become inflamed, they can cause itching, soreness, or burning.  Removal of a seborrheic keratosis can be accomplished by freezing, burning, or surgery. If any spot bleeds, scabs, or grows rapidly, please return to have it checked, as these can be an indication of a skin cancer.  Cryotherapy Aftercare  Wash gently with soap and water everyday.   Apply Vaseline and Band-Aid daily until healed.   Curel Hydrotherapy in shower moisturizer Zeasorb AF powder for rash lower abdomen    Due to recent changes in healthcare laws, you may see results of your pathology and/or laboratory studies on MyChart before the doctors have had a chance to review them. We understand that in some cases there may be results that are confusing or concerning to you. Please understand that not all results are received at the same time and often the doctors may need to interpret multiple results in order to  provide you with the best plan of care or course of treatment. Therefore, we ask that you please give [place] 2 business days to thoroughly review all your results before contacting the office for clarification. Should we see a critical lab result, you will be contacted sooner.   If You Need Anything After Your Visit  If you have any questions or concerns for your doctor, please call our main line at [phone] and press option 4 to reach your doctor's medical assistant. If no one answers, please leave a voicemail as directed and we will return your call as soon as possible. Messages left after 4 pm will be answered the following business day.   You may also send [place] a message via [place]. We typically respond to MyChart messages within 1-2 business days.  For prescription refills, please ask your pharmacy to contact our office. Our fax number is [phone].  If you have an urgent issue when the clinic is closed that cannot wait until the next business day, you can page your doctor at the number below.    Please note that while we do our best to be available for urgent issues outside of office hours, we are not available 24/7.   If you have an urgent issue and are unable to reach [place], you may choose to seek medical care at your doctor's office, retail clinic, urgent care center, or emergency room.  If you have a medical emergency, please immediately call 911 or  go to the emergency department.  Pager Numbers  - Dr. Nehemiah Massed: [phone]  - Dr. Laurence Ferrari: [phone]  - Dr. Nicole Kindred: [phone]  In the event of inclement weather, please call our main line at [phone] for an update on the status of any delays or closures.  Dermatology Medication Tips: Please keep the boxes that topical medications come in in order to help keep track of the instructions about where and how to use these. Pharmacies typically print the medication instructions only on the boxes and not directly on the  medication tubes.   If your medication is too expensive, please contact our office at [phone] option 4 or send [place] a message through [place].   We are unable to tell what your co-pay for medications will be in advance as this is different depending on your insurance coverage. However, we may be able to find a substitute medication at lower cost or fill out paperwork to get insurance to cover a needed medication.   If a prior authorization is required to get your medication covered by your insurance company, please allow [place] 1-2 business days to complete this process.  Drug prices often vary depending on where the prescription is filled and some pharmacies may offer cheaper prices.  The website www.goodrx.com contains coupons for medications through different pharmacies. The prices here do not account for what the cost may be with help from insurance (it may be cheaper with your insurance), but the website can give you the price if you did not use any insurance.  - You can print the associated coupon and take it with your prescription to the pharmacy.  - You may also stop by our office during regular business hours and pick up a GoodRx coupon card.  - If you need your prescription sent electronically to a different pharmacy, notify our office through [facility] or by phone at [phone] option 4.     Si Usted Necesita Algo Despus de Su Visita  Tambin puede enviarnos un mensaje a travs de Pharmacist, community. Por lo general respondemos a los mensajes de MyChart en el transcurso de 1 a 2 das hbiles.  Para renovar recetas, por favor pida a su farmacia que se ponga en contacto con nuestra oficina. Harland Dingwall de fax es [place] [phone].  Si tiene un asunto urgente cuando la clnica est cerrada y que no puede esperar hasta el siguiente da hbil, puede llamar/localizar a su doctor(a) al nmero que aparece a continuacin.   Por favor, tenga en cuenta que aunque hacemos todo lo posible  para estar disponibles para asuntos urgentes fuera del horario de [place], no estamos disponibles las 24 horas del da, los 7 das de la [place].   Si tiene un problema urgente y no puede comunicarse con nosotros, puede optar por buscar atencin mdica  en el consultorio de su doctor(a), en una clnica privada, en un centro de atencin urgente o en una sala de emergencias.  Si tiene Engineering geologist, por favor llame inmediatamente al 911 o vaya a la sala de emergencias.  Nmeros de bper  - Dr. Nehemiah Massed: [phone]  - Dra. Moye: [phone]  - Dra. Nicole Kindred: [phone]  En caso de inclemencias del [place], por favor llame a Johnsie Kindred principal al [phone] para una actualizacin sobre el [place] de cualquier retraso o cierre.  Consejos para la medicacin en dermatologa: Por favor, guarde las cajas en las que vienen los medicamentos de uso tpico para ayudarle a seguir las instrucciones sobre dnde y  cmo usarlos. Las farmacias generalmente imprimen las instrucciones del medicamento slo en las cajas y no directamente en los tubos del [place].   Si su medicamento es muy caro, por favor, pngase en contacto con Zigmund Daniel llamando al [phone] y presione la opcin 4 o envenos un mensaje a travs de Pharmacist, community.   No podemos decirle cul ser su copago por los medicamentos por adelantado ya que esto es diferente dependiendo de la cobertura de su seguro. Sin embargo, es posible que podamos encontrar un medicamento sustituto a Electrical engineer un formulario para que el seguro cubra el medicamento que se considera necesario.   Si se requiere una autorizacin previa para que su compaa de seguros [place] su medicamento, por favor permtanos de 1 a 2 das hbiles para completar este proceso.  Los precios de los medicamentos varan con frecuencia dependiendo del Environmental consultant de dnde se surte la receta y alguna farmacias pueden ofrecer precios ms baratos.  El sitio web  www.goodrx.com tiene cupones para medicamentos de Airline pilot. Los precios aqu no tienen en cuenta lo que podra costar con la ayuda del seguro (puede ser ms barato con su seguro), pero el sitio web puede darle el precio si no utiliz Research scientist (physical sciences).  - Puede imprimir el cupn correspondiente y llevarlo con su receta a la farmacia.  - Tambin puede pasar por nuestra oficina durante el horario de atencin regular y Charity fundraiser una tarjeta de cupones de GoodRx.  - Si necesita que su receta se enve electrnicamente a una farmacia diferente, informe a nuestra oficina a travs de MyChart de  o por telfono llamando al [phone] y presione la opcin 4.

## 2022-09-28 NOTE — Progress Notes (Signed)
New Patient Visit  Subjective  Michelle Bradley is a 61 y.o. female who presents for the following: Total body skin exam (Check spot on back, no symptoms, pts husband concerned about it, no hx of skin cancer, no fhx of skin cancer).  The patient presents for Total-Body Skin Exam (TBSE) for skin cancer screening and mole check.  The patient has spots, moles and lesions to be evaluated, some may be new or changing and the patient has concerns that these could be cancer.   The following portions of the chart were reviewed this encounter and updated as appropriate:       Review of Systems:  No other skin or systemic complaints except as noted in HPI or Assessment and Plan.  Objective  Well appearing patient in no apparent distress; mood and affect are within normal limits.  A full examination was performed including scalp, head, eyes, ears, nose, lips, neck, chest, axillae, abdomen, back, buttocks, bilateral upper extremities, bilateral lower extremities, hands, feet, fingers, toes, fingernails, and toenails. All findings within normal limits unless otherwise noted below.    R malar cheek x 1 7.65m waxy tan macule     lower abdomen Mild erythema/maceration left lower abdomen inguinal fold    Assessment & Plan   Lentigines - Scattered tan macules - Due to sun exposure - Benign-appearing, observe - Recommend daily broad spectrum sunscreen SPF 30+ to sun-exposed areas, reapply every 2 hours as needed. - Call for any changes - upper back  Seborrheic Keratoses - Stuck-on, waxy, tan-brown papules and/or plaques  - Benign-appearing - Discussed benign etiology and prognosis. - Observe - Call for any changes - face, back  Hemangiomas - Red papules - Discussed benign nature - Observe - Call for any changes - back, left leg  Actinic Damage - Chronic condition, secondary to cumulative UV/sun exposure - diffuse scaly erythematous macules with underlying dyspigmentation -  Recommend daily broad spectrum sunscreen SPF 30+ to sun-exposed areas, reapply every 2 hours as needed.  - Staying in the shade or wearing long sleeves, sun glasses (UVA+UVB protection) and wide brim hats (4-inch brim around the entire circumference of the hat) are also recommended for sun protection.  - Call for new or changing lesions. - face  Dermatofibroma - Firm pink/brown papulenodule with dimple sign - Benign appearing - Call for any changes  - L lower leg, R knee  Skin cancer screening performed today.   Seborrheic keratosis R malar cheek x 1  Discussed cosmetic procedure, noncovered.  $60 for 1st lesion and $15 for each additional lesion if done on the same day.  Maximum charge $350.  One touch-up treatment included no charge. Discussed risks of treatment including dyspigmentation, small scar, and/or recurrence. Recommend daily broad spectrum sunscreen SPF 30+/photoprotection to treated areas once healed.   Recheck on f/up  Destruction of lesion - R malar cheek x 1  Destruction method: cryotherapy   Informed consent: discussed and consent obtained   Lesion destroyed using liquid nitrogen: Yes   Region frozen until ice ball extended beyond lesion: Yes   Outcome: patient tolerated procedure well with no complications   Post-procedure details: wound care instructions given   Additional details:  Prior to procedure, discussed risks of blister formation, small wound, skin dyspigmentation, or rare scar following cryotherapy. Recommend Vaseline ointment to treated areas while healing.   Intertrigo lower abdomen  Chronic and persistent condition with duration or expected duration over one year. Condition is bothersome/symptomatic for patient. Currently flared.  Intertrigo is a chronic recurrent rash that occurs in skin fold areas that may be associated with friction; heat; moisture; yeast; fungus; and bacteria.  It is exacerbated by increased movement / activity; sweating; and  higher atmospheric temperature.  Start Zeasorb AF powder qam prn Start Ketoconazole 2% cr qd/bid prn flares  ketoconazole (NIZORAL) 2 % cream - lower abdomen Apply 1 Application topically as directed. Qd to bid aa rash lower abdomen until clear, then prn flares   Return in about 2 months (around 11/29/2022) for SK f/u.  I, Othelia Pulling, RMA, am acting as scribe for Brendolyn Patty, MD .   Documentation: I have reviewed the above documentation for accuracy and completeness, and I agree with the above.  Brendolyn Patty MD

## 2022-10-07 ENCOUNTER — Ambulatory Visit
Admission: RE | Admit: 2022-10-07 | Discharge: 2022-10-07 | Disposition: A | Discharge status: Home / Self Care | Payer: 59 | Source: Ambulatory Visit | Attending: Family Medicine | Admitting: Family Medicine

## 2022-10-07 DIAGNOSIS — Z1231 Encounter for screening mammogram for malignant neoplasm of breast: Secondary | ICD-10-CM | POA: Diagnosis present

## 2022-10-31 ENCOUNTER — Telehealth: Payer: Self-pay | Admitting: Family Medicine

## 2022-10-31 NOTE — Telephone Encounter (Signed)
[facility] faxed refill request for the following medications:  venlafaxine XR (EFFEXOR-XR) 75 MG 24 hr capsule    Please advise

## 2022-11-01 NOTE — Telephone Encounter (Signed)
Looks like she has a new PCP

## 2022-11-29 ENCOUNTER — Ambulatory Visit: Payer: 59 | Admitting: Dermatology

## 2022-11-29 VITALS — BP 141/84 | HR 103

## 2022-11-29 DIAGNOSIS — L821 Other seborrheic keratosis: Secondary | ICD-10-CM

## 2022-11-29 NOTE — Patient Instructions (Signed)
Due to recent changes in healthcare laws, you may see results of your pathology and/or laboratory studies on MyChart before the doctors have had a chance to review them. We understand that in some cases there may be results that are confusing or concerning to you. Please understand that not all results are received at the same time and often the doctors may need to interpret multiple results in order to provide you with the best plan of care or course of treatment. Therefore, we ask that you please give us 2 business days to thoroughly review all your results before contacting the office for clarification. Should we see a critical lab result, you will be contacted sooner.   If You Need Anything After Your Visit  If you have any questions or concerns for your doctor, please call our main line at [phone] and press option 4 to reach your doctor's medical assistant. If no one answers, please leave a voicemail as directed and we will return your call as soon as possible. Messages left after 4 pm will be answered the following business day.   You may also send us a message via MyChart. We typically respond to MyChart messages within 1-2 business days.  For prescription refills, please ask your pharmacy to contact our office. Our fax number is [phone].  If you have an urgent issue when the clinic is closed that cannot wait until the next business day, you can page your doctor at the number below.    Please note that while we do our best to be available for urgent issues outside of office hours, we are not available 24/7.   If you have an urgent issue and are unable to reach us, you may choose to seek medical care at your doctor's office, retail clinic, urgent care center, or emergency room.  If you have a medical emergency, please immediately call 911 or go to the emergency department.  Pager Numbers  - Dr. Kowalski: [phone]  - Dr. Moye: [phone]  - Dr. Stewart:  [phone]  In the event of inclement weather, please call our main line at [phone] for an update on the status of any delays or closures.  Dermatology Medication Tips: Please keep the boxes that topical medications come in in order to help keep track of the instructions about where and how to use these. Pharmacies typically print the medication instructions only on the boxes and not directly on the medication tubes.   If your medication is too expensive, please contact our office at [phone] option 4 or send us a message through MyChart.   We are unable to tell what your co-pay for medications will be in advance as this is different depending on your insurance coverage. However, we may be able to find a substitute medication at lower cost or fill out paperwork to get insurance to cover a needed medication.   If a prior authorization is required to get your medication covered by your insurance company, please allow us 1-2 business days to complete this process.  Drug prices often vary depending on where the prescription is filled and some pharmacies may offer cheaper prices.  The website www.goodrx.com contains coupons for medications through different pharmacies. The prices here do not account for what the cost may be with help from insurance (it may be cheaper with your insurance), but the website can give you the price if you did not use any insurance.  - You can print the associated coupon and take it with   your prescription to the pharmacy.  - You may also stop by our office during regular business hours and pick up a GoodRx coupon card.  - If you need your prescription sent electronically to a different pharmacy, notify our office through [facility] MyChart or by phone at [phone] option 4.     Si Usted Necesita Algo Despus de Su Visita  Tambin puede enviarnos un mensaje a travs de MyChart. Por lo general respondemos a los mensajes de MyChart en el transcurso de 1 a 2  das hbiles.  Para renovar recetas, por favor pida a su farmacia que se ponga en contacto con nuestra oficina. Nuestro nmero de fax es el [phone].  Si tiene un asunto urgente cuando la clnica est cerrada y que no puede esperar hasta el siguiente da hbil, puede llamar/localizar a su doctor(a) al nmero que aparece a continuacin.   Por favor, tenga en cuenta que aunque hacemos todo lo posible para estar disponibles para asuntos urgentes fuera del horario de oficina, no estamos disponibles las 24 horas del da, los 7 das de la semana.   Si tiene un problema urgente y no puede comunicarse con nosotros, puede optar por buscar atencin mdica  en el consultorio de su doctor(a), en una clnica privada, en un centro de atencin urgente o en una sala de emergencias.  Si tiene una emergencia mdica, por favor llame inmediatamente al 911 o vaya a la sala de emergencias.  Nmeros de bper  - Dr. Kowalski: [phone]  - Dra. Moye: [phone]  - Dra. Stewart: [phone]  En caso de inclemencias del tiempo, por favor llame a nuestra lnea principal al [phone] para una actualizacin sobre el estado de cualquier retraso o cierre.  Consejos para la medicacin en dermatologa: Por favor, guarde las cajas en las que vienen los medicamentos de uso tpico para ayudarle a seguir las instrucciones sobre dnde y cmo usarlos. Las farmacias generalmente imprimen las instrucciones del medicamento slo en las cajas y no directamente en los tubos del medicamento.   Si su medicamento es muy caro, por favor, pngase en contacto con nuestra oficina llamando al [phone] y presione la opcin 4 o envenos un mensaje a travs de MyChart.   No podemos decirle cul ser su copago por los medicamentos por adelantado ya que esto es diferente dependiendo de la cobertura de su seguro. Sin embargo, es posible que podamos encontrar un medicamento sustituto a menor costo o llenar un formulario para que el  seguro cubra el medicamento que se considera necesario.   Si se requiere una autorizacin previa para que su compaa de seguros cubra su medicamento, por favor permtanos de 1 a 2 das hbiles para completar este proceso.  Los precios de los medicamentos varan con frecuencia dependiendo del lugar de dnde se surte la receta y alguna farmacias pueden ofrecer precios ms baratos.  El sitio web www.goodrx.com tiene cupones para medicamentos de diferentes farmacias. Los precios aqu no tienen en cuenta lo que podra costar con la ayuda del seguro (puede ser ms barato con su seguro), pero el sitio web puede darle el precio si no utiliz ningn seguro.  - Puede imprimir el cupn correspondiente y llevarlo con su receta a la farmacia.  - Tambin puede pasar por nuestra oficina durante el horario de atencin regular y recoger una tarjeta de cupones de GoodRx.  - Si necesita que su receta se enve electrnicamente a una farmacia diferente, informe a nuestra oficina a travs de MyChart de [place]   o por telfono llamando al [phone] y presione la opcin 4.  

## 2022-11-29 NOTE — Progress Notes (Signed)
   Follow-Up Visit   Subjective  Michelle Bradley is a 62 y.o. female who presents for the following: Cosmetic SK f/u (R malar cheek, 71mf/u, s/p LN2 improved).   The following portions of the chart were reviewed this encounter and updated as appropriate:       Review of Systems:  No other skin or systemic complaints except as noted in HPI or Assessment and Plan.  Objective  Well appearing patient in no apparent distress; mood and affect are within normal limits.  A focused examination was performed including face. Relevant physical exam findings are noted in the Assessment and Plan.  R malar cheek Mild residual light tan pigmented macule, much improved when compared to photo    Assessment & Plan  Seborrheic keratosis R malar cheek  Improved with LN2, pt declines further treatment at this time   Return if symptoms worsen or fail to improve.  I, SOthelia Pulling RMA, am acting as scribe for TBrendolyn Patty MD .  Documentation: I have reviewed the above documentation for accuracy and completeness, and I agree with the above.  TBrendolyn PattyMD

## 2023-07-04 ENCOUNTER — Other Ambulatory Visit: Payer: Self-pay | Admitting: Family Medicine

## 2023-07-04 DIAGNOSIS — Z1231 Encounter for screening mammogram for malignant neoplasm of breast: Secondary | ICD-10-CM

## 2023-08-07 ENCOUNTER — Other Ambulatory Visit: Payer: Self-pay

## 2023-08-09 ENCOUNTER — Other Ambulatory Visit: Payer: Self-pay

## 2023-08-09 MED ORDER — PREDNISONE 10 MG PO TABS
ORAL_TABLET | ORAL | 0 refills | Status: AC
  Filled 2023-08-09: qty 21, 6d supply, fill #0

## 2023-09-19 ENCOUNTER — Other Ambulatory Visit: Payer: Self-pay

## 2023-09-20 ENCOUNTER — Other Ambulatory Visit: Payer: Self-pay

## 2023-09-20 MED ORDER — KETOCONAZOLE 2 % EX CREA
TOPICAL_CREAM | Freq: Two times a day (BID) | CUTANEOUS | 0 refills | Status: DC
  Filled 2023-09-20: qty 60, 30d supply, fill #0

## 2023-09-20 MED ORDER — MEDROXYPROGESTERONE ACETATE 2.5 MG PO TABS
2.5000 mg | ORAL_TABLET | Freq: Every day | ORAL | 0 refills | Status: DC
  Filled 2023-09-20: qty 90, 90d supply, fill #0

## 2023-09-25 ENCOUNTER — Other Ambulatory Visit: Payer: Self-pay

## 2023-09-25 MED ORDER — MONTELUKAST SODIUM 10 MG PO TABS
10.0000 mg | ORAL_TABLET | Freq: Every day | ORAL | 1 refills | Status: DC
  Filled 2023-09-25: qty 90, 90d supply, fill #0
  Filled 2023-12-21 (×2): qty 90, 90d supply, fill #1

## 2023-09-26 ENCOUNTER — Other Ambulatory Visit: Payer: Self-pay

## 2023-10-02 ENCOUNTER — Other Ambulatory Visit: Payer: Self-pay

## 2023-10-02 MED ORDER — ESTRADIOL 0.5 MG PO TABS
0.5000 mg | ORAL_TABLET | Freq: Every day | ORAL | 1 refills | Status: AC
  Filled 2023-10-02: qty 90, 90d supply, fill #0

## 2023-10-03 ENCOUNTER — Ambulatory Visit: Payer: 59 | Admitting: Dermatology

## 2023-10-09 ENCOUNTER — Ambulatory Visit
Admission: RE | Admit: 2023-10-09 | Discharge: 2023-10-09 | Disposition: A | Discharge status: Home / Self Care | Payer: Managed Care, Other (non HMO) | Source: Ambulatory Visit | Attending: Family Medicine | Admitting: Family Medicine

## 2023-10-09 DIAGNOSIS — Z1231 Encounter for screening mammogram for malignant neoplasm of breast: Secondary | ICD-10-CM | POA: Insufficient documentation

## 2023-10-15 ENCOUNTER — Other Ambulatory Visit: Payer: Self-pay

## 2023-10-15 MED ORDER — FLUTICASONE PROPIONATE 50 MCG/ACT NA SUSP
2.0000 | Freq: Every day | NASAL | 1 refills | Status: AC
  Filled 2023-10-15: qty 16, 30d supply, fill #0

## 2023-10-16 ENCOUNTER — Other Ambulatory Visit: Payer: Self-pay

## 2023-11-06 ENCOUNTER — Other Ambulatory Visit (HOSPITAL_BASED_OUTPATIENT_CLINIC_OR_DEPARTMENT_OTHER): Payer: Self-pay

## 2023-11-06 ENCOUNTER — Other Ambulatory Visit: Payer: Self-pay

## 2023-11-06 MED ORDER — VENLAFAXINE HCL 75 MG PO TABS
75.0000 mg | ORAL_TABLET | Freq: Every day | ORAL | 3 refills | Status: DC
  Filled 2023-11-06: qty 90, 90d supply, fill #0
  Filled 2024-02-05: qty 90, 90d supply, fill #1
  Filled 2024-05-02: qty 90, 90d supply, fill #2
  Filled 2024-08-01 (×2): qty 90, 90d supply, fill #3

## 2023-12-08 ENCOUNTER — Other Ambulatory Visit: Payer: Self-pay

## 2023-12-20 ENCOUNTER — Other Ambulatory Visit: Payer: Self-pay

## 2023-12-20 MED ORDER — MEDROXYPROGESTERONE ACETATE 2.5 MG PO TABS
2.5000 mg | ORAL_TABLET | Freq: Every day | ORAL | 1 refills | Status: DC
Start: 2023-12-20 — End: 2024-06-18
  Filled 2023-12-20: qty 90, 90d supply, fill #0
  Filled 2024-03-20 (×2): qty 90, 90d supply, fill #1

## 2023-12-21 ENCOUNTER — Other Ambulatory Visit: Payer: Self-pay

## 2024-01-02 ENCOUNTER — Other Ambulatory Visit: Payer: Self-pay

## 2024-01-02 MED ORDER — PREDNISONE 10 MG PO TABS
ORAL_TABLET | ORAL | 0 refills | Status: AC
  Filled 2024-01-02: qty 20, 8d supply, fill #0

## 2024-01-03 ENCOUNTER — Other Ambulatory Visit: Payer: Self-pay

## 2024-01-03 MED ORDER — AZITHROMYCIN 250 MG PO TABS
ORAL_TABLET | ORAL | 0 refills | Status: AC
  Filled 2024-01-03: qty 6, 5d supply, fill #0

## 2024-01-05 ENCOUNTER — Other Ambulatory Visit: Payer: Self-pay

## 2024-01-11 ENCOUNTER — Other Ambulatory Visit: Payer: Self-pay

## 2024-01-11 MED ORDER — ESTRADIOL 0.5 MG PO TABS
0.5000 mg | ORAL_TABLET | Freq: Every day | ORAL | 1 refills | Status: AC
  Filled 2024-01-11: qty 90, 90d supply, fill #0
  Filled 2024-04-02: qty 90, 90d supply, fill #1

## 2024-02-05 ENCOUNTER — Other Ambulatory Visit: Payer: Self-pay

## 2024-02-06 ENCOUNTER — Other Ambulatory Visit: Payer: Self-pay

## 2024-02-13 ENCOUNTER — Ambulatory Visit: Payer: 59 | Admitting: Dermatology

## 2024-02-13 DIAGNOSIS — W908XXA Exposure to other nonionizing radiation, initial encounter: Secondary | ICD-10-CM

## 2024-02-13 DIAGNOSIS — L814 Other melanin hyperpigmentation: Secondary | ICD-10-CM

## 2024-02-13 DIAGNOSIS — L853 Xerosis cutis: Secondary | ICD-10-CM

## 2024-02-13 DIAGNOSIS — L821 Other seborrheic keratosis: Secondary | ICD-10-CM

## 2024-02-13 DIAGNOSIS — Z1283 Encounter for screening for malignant neoplasm of skin: Secondary | ICD-10-CM

## 2024-02-13 DIAGNOSIS — D1801 Hemangioma of skin and subcutaneous tissue: Secondary | ICD-10-CM

## 2024-02-13 DIAGNOSIS — L578 Other skin changes due to chronic exposure to nonionizing radiation: Secondary | ICD-10-CM

## 2024-02-13 DIAGNOSIS — D229 Melanocytic nevi, unspecified: Secondary | ICD-10-CM

## 2024-02-13 DIAGNOSIS — L304 Erythema intertrigo: Secondary | ICD-10-CM

## 2024-02-13 NOTE — Progress Notes (Signed)
 Follow-Up Visit   Subjective  Michelle Bradley is a 63 y.o. female who presents for the following: Skin Cancer Screening and Full Body Skin Exam  The patient presents for Total-Body Skin Exam (TBSE) for skin cancer screening and mole check. The patient has spots, moles and lesions to be evaluated, some may be new or changing. She has a couple of dark spots on her face she may want treated. No history of skin cancer.     The following portions of the chart were reviewed this encounter and updated as appropriate: medications, allergies, medical history  Review of Systems:  No other skin or systemic complaints except as noted in HPI or Assessment and Plan.  Objective  Well appearing patient in no apparent distress; mood and affect are within normal limits.  A full examination was performed including scalp, head, eyes, ears, nose, lips, neck, chest, axillae, abdomen, back, buttocks, bilateral upper extremities, bilateral lower extremities, hands, feet, fingers, toes, fingernails, and toenails. All findings within normal limits unless otherwise noted below.   Relevant physical exam findings are noted in the Assessment and Plan.    Assessment & Plan   SKIN CANCER SCREENING PERFORMED TODAY.  ACTINIC DAMAGE - Chronic condition, secondary to cumulative UV/sun exposure - diffuse scaly erythematous macules with underlying dyspigmentation - Recommend daily broad spectrum sunscreen SPF 30+ to sun-exposed areas, reapply every 2 hours as needed.  - Staying in the shade or wearing long sleeves, sun glasses (UVA+UVB protection) and wide brim hats (4-inch brim around the entire circumference of the hat) are also recommended for sun protection.  - Call for new or changing lesions.  LENTIGINES, SEBORRHEIC KERATOSES, HEMANGIOMAS - Benign normal skin lesions - Benign-appearing - Call for any changes  MELANOCYTIC NEVI - Tan-brown and/or pink-flesh-colored symmetric macules and papules - Benign  appearing on exam today - Observation - Call clinic for new or changing moles - Recommend daily use of broad spectrum spf 30+ sunscreen to sun-exposed areas.   INTERTRIGO Exam: Clear today  Chronic condition with duration or expected duration over one year. Currently well-controlled.   Intertrigo is a chronic recurrent rash that occurs in skin fold areas that may be associated with friction; heat; moisture; yeast; fungus; and bacteria.  It is exacerbated by increased movement / activity; sweating; and higher atmospheric temperature.  Use of an absorbant powder such as Zeasorb AF powder or other OTC antifungal powder to the area daily can prevent rash recurrence. Other options to help keep the area dry include blow drying the area after bathing or using antiperspirant products such as Duradry sweat minimizing gel.  Treatment Plan: Continue ketoconazole  2% cream 1-2 times a day, prn. Patient will call for refills.    SEBORRHEIC KERATOSIS - Stuck-on, waxy, tan-brown papules, including Left cheek, see photo  - Benign-appearing - Discussed benign etiology and prognosis. - Pt would like Sks left cheek removed  Discussed cosmetic procedure, noncovered.  $60 for 1st lesion and $15 for each additional lesion if done on the same day.  Maximum charge $350.  One touch-up treatment included no charge. Discussed risks of treatment including dyspigmentation, small scar, and/or recurrence. Recommend daily broad spectrum sunscreen SPF 30+/photoprotection to treated areas once healed.  LN2 x 2 lesions today. $75  Destruction Procedure Note Destruction method: cryotherapy   Informed consent: discussed and consent obtained   Lesion destroyed using liquid nitrogen: Yes   Outcome: patient tolerated procedure well with no complications   Post-procedure details: wound care instructions given  Locations: left cheek, left zygoma  # of Lesions Treated: 2  Prior to procedure, discussed risks of blister  formation, small wound, skin dyspigmentation, or rare scar following cryotherapy. Recommend Vaseline ointment to treated areas while healing. Photo taken.  Xerosis - diffuse xerotic patches - recommend gentle, hydrating skin care - gentle skin care handout given   Return in about 1 year (around 02/12/2025) for TBSE, 2 mo f/u SK recheck.  IBernardine Bridegroom, CMA, am acting as scribe for Artemio Larry, MD .   Documentation: I have reviewed the above documentation for accuracy and completeness, and I agree with the above.  Artemio Larry, MD

## 2024-02-13 NOTE — Patient Instructions (Addendum)
 Cryotherapy Aftercare  Wash gently with soap and water everyday.   Apply Vaseline and Band-Aid daily until healed.   Discussed cosmetic procedure, noncovered.  $60 for 1st lesion and $15 for each additional lesion if done on the same day.  Maximum charge $350.  One touch-up treatment included no charge. Discussed risks of treatment including dyspigmentation, small scar, and/or recurrence. Recommend daily broad spectrum sunscreen SPF 30+/photoprotection to treated areas once healed.    Gentle Skin Care Guide  1. Bathe no more than once a day.  2. Avoid bathing in hot water  3. Use a mild soap like Dove, Vanicream, Cetaphil, CeraVe. Can use Lever 2000 or Cetaphil antibacterial soap  4. Use soap only where you need it. On most days, use it under your arms, between your legs, and on your feet. Let the water rinse other areas unless visibly dirty.  5. When you get out of the bath/shower, use a towel to gently blot your skin dry, don't rub it.  6. While your skin is still a little damp, apply a moisturizing cream such as Vanicream, CeraVe, Cetaphil, Eucerin, Sarna lotion or plain Vaseline Jelly. For hands apply Neutrogena [place] Hand Cream or Excipial Hand Cream.  7. Reapply moisturizer any time you start to itch or feel dry.  8. Sometimes using free and clear laundry detergents can be helpful. Fabric softener sheets should be avoided. Downy Free & Gentle liquid, or any liquid fabric softener that is free of dyes and perfumes, it acceptable to use  9. If your doctor has given you prescription creams you may apply moisturizers over them    Due to recent changes in healthcare laws, you may see results of your pathology and/or laboratory studies on MyChart before the doctors have had a chance to review them. We understand that in some cases there may be results that are confusing or concerning to you. Please understand that not all results are received at the same time and often the doctors  may need to interpret multiple results in order to provide you with the best plan of care or course of treatment. Therefore, we ask that you please give us  2 business days to thoroughly review all your results before contacting the office for clarification. Should we see a critical lab result, you will be contacted sooner.   If You Need Anything After Your Visit  If you have any questions or concerns for your doctor, please call our main line at [phone] and press option 4 to reach your doctor's medical assistant. If no one answers, please leave a voicemail as directed and we will return your call as soon as possible. Messages left after 4 pm will be answered the following business day.   You may also send us  a message via MyChart. We typically respond to MyChart messages within 1-2 business days.  For prescription refills, please ask your pharmacy to contact our office. Our fax number is [phone].  If you have an urgent issue when the clinic is closed that cannot wait until the next business day, you can page your doctor at the number below.    Please note that while we do our best to be available for urgent issues outside of office hours, we are not available 24/7.   If you have an urgent issue and are unable to reach us , you may choose to seek medical care at your doctor's office, retail clinic, urgent care center, or emergency room.  If you have a medical emergency,  please immediately call 911 or go to the emergency department.  Pager Numbers  - Dr. Bary Likes: [phone]  - Dr. Annette Barters: [phone]  - Dr. Felipe Horton: [phone]   In the event of inclement weather, please call our main line at [phone] for an update on the status of any delays or closures.  Dermatology Medication Tips: Please keep the boxes that topical medications come in in order to help keep track of the instructions about where and how to use these. Pharmacies typically print the medication  instructions only on the boxes and not directly on the medication tubes.   If your medication is too expensive, please contact our office at [phone] option 4 or send us  a message through MyChart.   We are unable to tell what your co-pay for medications will be in advance as this is different depending on your insurance coverage. However, we may be able to find a substitute medication at lower cost or fill out paperwork to get insurance to cover a needed medication.   If a prior authorization is required to get your medication covered by your insurance company, please allow us  1-2 business days to complete this process.  Drug prices often vary depending on where the prescription is filled and some pharmacies may offer cheaper prices.  The website www.goodrx.com contains coupons for medications through different pharmacies. The prices here do not account for what the cost may be with help from insurance (it may be cheaper with your insurance), but the website can give you the price if you did not use any insurance.  - You can print the associated coupon and take it with your prescription to the pharmacy.  - You may also stop by our office during regular business hours and pick up a GoodRx coupon card.  - If you need your prescription sent electronically to a different pharmacy, notify our office through [facility] or by phone at [phone] option 4.     Si Usted Necesita Algo Despus de Su Visita  Tambin puede enviarnos un mensaje a travs de Clinical cytogeneticist. Por lo general respondemos a los mensajes de MyChart en el transcurso de 1 a 2 das hbiles.  Para renovar recetas, por favor pida a su farmacia que se ponga en contacto con nuestra oficina. Franz Jacks de fax es [place] [phone].  Si tiene un asunto urgente cuando la clnica est cerrada y que no puede esperar hasta el siguiente da hbil, puede llamar/localizar a su doctor(a) al nmero que aparece a continuacin.   Por  favor, tenga en cuenta que aunque hacemos todo lo posible para estar disponibles para asuntos urgentes fuera del horario de [place], no estamos disponibles las 24 horas del da, los 7 [address]  [address] de la [place].   Si tiene un problema urgente y no puede comunicarse con nosotros, puede optar por buscar atencin mdica  en el consultorio de su doctor(a), en una clnica privada, en un centro de atencin urgente o en una sala de emergencias.  Si tiene Engineer, drilling, por favor llame inmediatamente al 911 o vaya a la sala de emergencias.  Nmeros de bper  - Dr. Bary Likes: [phone]  - Dra. Annette Barters: [phone]  - Dr. Felipe Horton: [phone]   En caso de inclemencias del tiempo, por favor llame a Lajuan Pila principal al [phone] para una actualizacin sobre el [place] de cualquier retraso o cierre.  Consejos para la medicacin en dermatologa: Por favor, guarde las cajas en las que vienen los medicamentos de uso tpico para ayudarle  a seguir las [employer] dnde y cmo usarlos. Las farmacias generalmente imprimen las instrucciones del medicamento slo en las cajas y no directamente en los tubos del [place].   Si su medicamento es muy caro, por favor, pngase en contacto con Bettyjane Brunet llamando al [phone] y presione la opcin 4 o envenos un mensaje a travs de Clinical cytogeneticist.   No podemos decirle cul ser su copago por los medicamentos por adelantado ya que esto es diferente dependiendo de la cobertura de su seguro. Sin embargo, es posible que podamos encontrar un medicamento sustituto a Audiological scientist un formulario para que el seguro cubra el medicamento que se considera necesario.   Si se requiere una autorizacin previa para que su compaa de seguros [place] su medicamento, por favor permtanos de 1 a 2 das hbiles para completar este proceso.  Los precios de los medicamentos varan con frecuencia dependiendo del Environmental consultant de dnde se surte la receta y alguna farmacias  pueden ofrecer precios ms baratos.  El sitio web www.goodrx.com tiene cupones para medicamentos de Health and safety inspector. Los precios aqu no tienen en cuenta lo que podra costar con la ayuda del seguro (puede ser ms barato con su seguro), pero el sitio web puede darle el precio si no utiliz Tourist information centre manager.  - Puede imprimir el cupn correspondiente y llevarlo con su receta a la farmacia.  - Tambin puede pasar por nuestra oficina durante el horario de atencin regular y Education officer, museum una tarjeta de cupones de GoodRx.  - Si necesita que su receta se enve electrnicamente a una farmacia diferente, informe a nuestra oficina a travs de MyChart de [place] o por telfono llamando al [phone] y presione la opcin 4.

## 2024-03-20 ENCOUNTER — Other Ambulatory Visit: Payer: Self-pay

## 2024-03-20 MED ORDER — MONTELUKAST SODIUM 10 MG PO TABS
10.0000 mg | ORAL_TABLET | Freq: Every day | ORAL | 1 refills | Status: DC
  Filled 2024-03-20: qty 90, 90d supply, fill #0
  Filled 2024-06-19: qty 90, 90d supply, fill #1

## 2024-04-02 ENCOUNTER — Other Ambulatory Visit: Payer: Self-pay

## 2024-04-16 ENCOUNTER — Ambulatory Visit: Admitting: Dermatology

## 2024-04-16 ENCOUNTER — Other Ambulatory Visit: Payer: Self-pay

## 2024-04-16 ENCOUNTER — Encounter: Payer: Self-pay | Admitting: Dermatology

## 2024-04-16 DIAGNOSIS — L821 Other seborrheic keratosis: Secondary | ICD-10-CM | POA: Diagnosis not present

## 2024-04-16 DIAGNOSIS — L814 Other melanin hyperpigmentation: Secondary | ICD-10-CM

## 2024-04-16 DIAGNOSIS — L304 Erythema intertrigo: Secondary | ICD-10-CM

## 2024-04-16 DIAGNOSIS — Z7189 Other specified counseling: Secondary | ICD-10-CM

## 2024-04-16 MED ORDER — KETOCONAZOLE 2 % EX CREA
TOPICAL_CREAM | Freq: Two times a day (BID) | CUTANEOUS | 11 refills | Status: AC
  Filled 2024-04-16 – 2024-05-03 (×2): qty 60, 30d supply, fill #0

## 2024-04-16 NOTE — Progress Notes (Signed)
   Follow-Up Visit   Subjective  Michelle Bradley is a 63 y.o. female who presents for the following: recheck of cosmetic SK follow up. Left zygoma, left cheek. Tx with LN2 02/13/2024.  Needs refills of Ketoconazole  cream for intertrigo. Flares worse during summer.   The patient has spots, moles and lesions to be evaluated, some may be new or changing and the patient may have concern these could be cancer.   The following portions of the chart were reviewed this encounter and updated as appropriate: medications, allergies, medical history  Review of Systems:  No other skin or systemic complaints except as noted in HPI or Assessment and Plan.  Objective  Well appearing patient in no apparent distress; mood and affect are within normal limits.  A focused examination was performed of the following areas: Face   Relevant physical exam findings are noted in the Assessment and Plan.      Assessment & Plan   SEBORRHEIC KERATOSIS - Stuck-on, waxy, tan-brown papules and/or plaques  - Benign-appearing - Discussed benign etiology and prognosis. - Observe - Call for any changes  LENTIGINES Exam: scattered tan macules face, spots previously treated with LN2  on L cheek have cleared when compared to prior photo.  New photo today. Due to sun exposure Treatment Plan: Benign-appearing, observe. Recommend daily broad spectrum sunscreen SPF 30+ to sun-exposed areas, reapply every 2 hours as needed.  Call for any changes  Counseling for BBL / IPL / Laser and Coordination of Care Discussed the treatment option of Broad Band Light (BBL) /Intense Pulsed Light (IPL)/ Laser for skin discoloration, including brown spots and redness.  Typically we recommend at least 1-3 treatment sessions about 5-8 weeks apart for best results.  Cannot have tanned skin when BBL performed, and regular use of sunscreen/photoprotection is advised after the procedure to help maintain results. The patient's condition may also  require maintenance treatments in the future.  The fee for BBL / laser treatments is $350 per treatment session for the whole face.  A fee can be quoted for other parts of the body.  Insurance typically does not pay for BBL/laser treatments and therefore the fee is an out-of-pocket cost. Recommend prophylactic valtrex treatment. Once scheduled for procedure, will send Rx in prior to patient's appointment.    INTERTRIGO Exam: Mild erythema at lower abdominal crease  Chronic condition with duration or expected duration over one year. Currently well-controlled with treatment- needs rfs.   Intertrigo is a chronic recurrent rash that occurs in skin fold areas that may be associated with friction; heat; moisture; yeast; fungus; and bacteria.  It is exacerbated by increased movement / activity; sweating; and higher atmospheric temperature.  Use of an absorbant powder such as Zeasorb AF powder or other OTC antifungal powder to the area daily can prevent rash recurrence. Other options to help keep the area dry include blow drying the area after bathing or using antiperspirant products such as Duradry sweat minimizing gel.  Treatment Plan: Continue Ketoconazole  2% cream twice a day as needed for flares  Recommend OTC Zeasorb AF powder to body folds daily after shower.  It is often found in the athlete's foot section in the pharmacy.       Return for TBSE As Scheduled.  I, Jill Parcell, CMA, am acting as scribe for Rexene Rattler, MD.   Documentation: I have reviewed the above documentation for accuracy and completeness, and I agree with the above.  Rexene Rattler, MD

## 2024-04-16 NOTE — Patient Instructions (Addendum)
 Continue Ketoconazole  2% cream twice a day as needed for flares  Recommend OTC Zeasorb AF powder to body folds daily after shower.  It is often found in the athlete's foot section in the pharmacy.     Counseling for BBL / IPL / Laser and Coordination of Care Discussed the treatment option of Broad Band Light (BBL) /Intense Pulsed Light (IPL)/ Laser for skin discoloration, including brown spots and redness.  Typically we recommend at least 1-3 treatment sessions about 5-8 weeks apart for best results.  Cannot have tanned skin when BBL performed, and regular use of sunscreen/photoprotection is advised after the procedure to help maintain results. The patient's condition may also require maintenance treatments in the future.  The fee for BBL / laser treatments is $350 per treatment session for the whole face.  A fee can be quoted for other parts of the body.  Insurance typically does not pay for BBL/laser treatments and therefore the fee is an out-of-pocket cost. Recommend prophylactic valtrex treatment. Once scheduled for procedure, will send Rx in prior to patient's appointment.     Recommend daily broad spectrum sunscreen SPF 30+ to sun-exposed areas, reapply every 2 hours as needed. Call for new or changing lesions.  Staying in the shade or wearing long sleeves, sun glasses (UVA+UVB protection) and wide brim hats (4-inch brim around the entire circumference of the hat) are also recommended for sun protection.      Due to recent changes in healthcare laws, you may see results of your pathology and/or laboratory studies on MyChart before the doctors have had a chance to review them. We understand that in some cases there may be results that are confusing or concerning to you. Please understand that not all results are received at the same time and often the doctors may need to interpret multiple results in order to provide you with the best plan of care or course of treatment. Therefore, we ask that  you please give us  2 business days to thoroughly review all your results before contacting the office for clarification. Should we see a critical lab result, you will be contacted sooner.   If You Need Anything After Your Visit  If you have any questions or concerns for your doctor, please call our main line at [phone] and press option 4 to reach your doctor's medical assistant. If no one answers, please leave a voicemail as directed and we will return your call as soon as possible. Messages left after 4 pm will be answered the following business day.   You may also send us  a message via MyChart. We typically respond to MyChart messages within 1-2 business days.  For prescription refills, please ask your pharmacy to contact our office. Our fax number is [phone].  If you have an urgent issue when the clinic is closed that cannot wait until the next business day, you can page your doctor at the number below.    Please note that while we do our best to be available for urgent issues outside of office hours, we are not available 24/7.   If you have an urgent issue and are unable to reach us , you may choose to seek medical care at your doctor's office, retail clinic, urgent care center, or emergency room.  If you have a medical emergency, please immediately call 911 or go to the emergency department.  Pager Numbers  - Dr. Hester: [phone]  - Dr. Jackquline: [phone]  - Dr. Claudene: [phone]   In the  event of inclement weather, please call our main line at [phone] for an update on the status of any delays or closures.  Dermatology Medication Tips: Please keep the boxes that topical medications come in in order to help keep track of the instructions about where and how to use these. Pharmacies typically print the medication instructions only on the boxes and not directly on the medication tubes.   If your medication is too expensive, please contact our office at  [phone] option 4 or send us  a message through MyChart.   We are unable to tell what your co-pay for medications will be in advance as this is different depending on your insurance coverage. However, we may be able to find a substitute medication at lower cost or fill out paperwork to get insurance to cover a needed medication.   If a prior authorization is required to get your medication covered by your insurance company, please allow us  1-2 business days to complete this process.  Drug prices often vary depending on where the prescription is filled and some pharmacies may offer cheaper prices.  The website www.goodrx.com contains coupons for medications through different pharmacies. The prices here do not account for what the cost may be with help from insurance (it may be cheaper with your insurance), but the website can give you the price if you did not use any insurance.  - You can print the associated coupon and take it with your prescription to the pharmacy.  - You may also stop by our office during regular business hours and pick up a GoodRx coupon card.  - If you need your prescription sent electronically to a different pharmacy, notify our office through [facility] or by phone at [phone] option 4.     Si Usted Necesita Algo Despus de Su Visita  Tambin puede enviarnos un mensaje a travs de Clinical cytogeneticist. Por lo general respondemos a los mensajes de MyChart en el transcurso de 1 a 2 das hbiles.  Para renovar recetas, por favor pida a su farmacia que se ponga en contacto con nuestra oficina. Randi lakes de fax es [place] [phone].  Si tiene un asunto urgente cuando la clnica est cerrada y que no puede esperar hasta el siguiente da hbil, puede llamar/localizar a su doctor(a) al nmero que aparece a continuacin.   Por favor, tenga en cuenta que aunque hacemos todo lo posible para estar disponibles para asuntos urgentes fuera del horario de [place], no estamos  disponibles las 24 horas del da, los 7 [address]  [address] de la [place].   Si tiene un problema urgente y no puede comunicarse con nosotros, puede optar por buscar atencin mdica  en el consultorio de su doctor(a), en una clnica privada, en un centro de atencin urgente o en una sala de emergencias.  Si tiene Engineer, drilling, por favor llame inmediatamente al 911 o vaya a la sala de emergencias.  Nmeros de bper  - Dr. Hester: [phone]  - Dra. Jackquline: [phone]  - Dr. Claudene: [phone]   En caso de inclemencias del tiempo, por favor llame a landry capes principal al [phone] para una actualizacin sobre el [place] de cualquier retraso o cierre.  Consejos para la medicacin en dermatologa: Por favor, guarde las cajas en las que vienen los medicamentos de uso tpico para ayudarle a seguir las instrucciones sobre dnde y cmo usarlos. Las farmacias generalmente imprimen las instrucciones del medicamento slo en las cajas y no directamente en los tubos del [place].   Si  su medicamento es muy caro, por favor, pngase en contacto con landry rieger llamando al [phone] y presione la opcin 4 o envenos un mensaje a travs de Clinical cytogeneticist.   No podemos decirle cul ser su copago por los medicamentos por adelantado ya que esto es diferente dependiendo de la cobertura de su seguro. Sin embargo, es posible que podamos encontrar un medicamento sustituto a Audiological scientist un formulario para que el seguro cubra el medicamento que se considera necesario.   Si se requiere una autorizacin previa para que su compaa de seguros [place] su medicamento, por favor permtanos de 1 a 2 das hbiles para completar este proceso.  Los precios de los medicamentos varan con frecuencia dependiendo del Environmental consultant de dnde se surte la receta y alguna farmacias pueden ofrecer precios ms baratos.  El sitio web www.goodrx.com tiene cupones para medicamentos de Health and safety inspector. Los precios aqu no  tienen en cuenta lo que podra costar con la ayuda del seguro (puede ser ms barato con su seguro), pero el sitio web puede darle el precio si no utiliz Tourist information centre manager.  - Puede imprimir el cupn correspondiente y llevarlo con su receta a la farmacia.  - Tambin puede pasar por nuestra oficina durante el horario de atencin regular y Education officer, museum una tarjeta de cupones de GoodRx.  - Si necesita que su receta se enve electrnicamente a una farmacia diferente, informe a nuestra oficina a travs de MyChart de [place] o por telfono llamando al [phone] y presione la opcin 4.

## 2024-04-29 ENCOUNTER — Other Ambulatory Visit: Payer: Self-pay

## 2024-05-02 ENCOUNTER — Other Ambulatory Visit: Payer: Self-pay

## 2024-05-03 ENCOUNTER — Other Ambulatory Visit: Payer: Self-pay

## 2024-06-18 ENCOUNTER — Other Ambulatory Visit: Payer: Self-pay

## 2024-06-18 MED ORDER — MEDROXYPROGESTERONE ACETATE 2.5 MG PO TABS
2.5000 mg | ORAL_TABLET | Freq: Every day | ORAL | 1 refills | Status: AC
  Filled 2024-06-18: qty 90, 90d supply, fill #0
  Filled 2024-09-17: qty 90, 90d supply, fill #1

## 2024-06-19 ENCOUNTER — Other Ambulatory Visit: Payer: Self-pay

## 2024-07-03 ENCOUNTER — Other Ambulatory Visit: Payer: Self-pay

## 2024-07-03 MED ORDER — ESTRADIOL 0.5 MG PO TABS
0.5000 mg | ORAL_TABLET | Freq: Every day | ORAL | 1 refills | Status: AC
  Filled 2024-07-03: qty 90, 90d supply, fill #0
  Filled 2024-10-01: qty 90, 90d supply, fill #1

## 2024-07-09 ENCOUNTER — Other Ambulatory Visit: Payer: Self-pay | Admitting: Family Medicine

## 2024-07-09 DIAGNOSIS — Z1231 Encounter for screening mammogram for malignant neoplasm of breast: Secondary | ICD-10-CM

## 2024-08-01 ENCOUNTER — Other Ambulatory Visit: Payer: Self-pay

## 2024-09-17 ENCOUNTER — Other Ambulatory Visit: Payer: Self-pay

## 2024-09-17 MED ORDER — MONTELUKAST SODIUM 10 MG PO TABS
10.0000 mg | ORAL_TABLET | Freq: Every day | ORAL | 1 refills | Status: AC
  Filled 2024-09-17: qty 90, 90d supply, fill #0

## 2024-09-18 ENCOUNTER — Other Ambulatory Visit: Payer: Self-pay

## 2024-09-27 ENCOUNTER — Other Ambulatory Visit: Payer: Self-pay

## 2024-09-27 MED ORDER — NA SULFATE-K SULFATE-MG SULF 17.5-3.13-1.6 GM/177ML PO SOLN
ORAL | 0 refills | Status: AC
  Filled 2024-09-27: qty 354, 1d supply, fill #0

## 2024-10-01 ENCOUNTER — Other Ambulatory Visit: Payer: Self-pay

## 2024-10-08 ENCOUNTER — Encounter: Payer: Self-pay | Admitting: Gastroenterology

## 2024-10-15 ENCOUNTER — Ambulatory Visit
Admission: RE | Admit: 2024-10-15 | Discharge: 2024-10-15 | Disposition: A | Discharge status: Home / Self Care | Source: Ambulatory Visit | Attending: Family Medicine | Admitting: Family Medicine

## 2024-10-15 DIAGNOSIS — Z1231 Encounter for screening mammogram for malignant neoplasm of breast: Secondary | ICD-10-CM | POA: Insufficient documentation

## 2024-10-24 NOTE — H&P (Signed)
 "  Pre-Procedure H&P   Patient ID: Michelle Bradley is a 64 y.o. female.  Gastroenterology Provider: Elspeth Ozell Jungling, DO  Referring Provider: Gaetana Haddock, NP PCP: Michelle Michelle CROME, NP  Date: 10/25/2024  HPI [name] is a 64 y.o. female who presents today for Colonoscopy for Colorectal cancer screening .  Last colonoscopy in August 2015 normal aside from internal hemorrhoids.  No family history of colon cancer or colon polyps  Bowels moving regularly without melena or hematochezia   Past Medical History:  Diagnosis Date   Anxiety    Hot flash, menopausal    Menopausal syndrome (hot flashes)     Past Surgical History:  Procedure Laterality Date   BREAST CYST ASPIRATION Left 2004   CERVIX SURGERY     sawing up cervix internally   CESAREAN SECTION  1985   COLONOSCOPY N/A    DILATION AND CURETTAGE OF UTERUS     x 2 after c section    Family History No h/o GI disease or malignancy  Review of Systems  Constitutional:  Negative for activity change, appetite change, chills, diaphoresis, fatigue, fever and unexpected weight change.  HENT:  Negative for trouble swallowing and voice change.   Respiratory:  Negative for shortness of breath and wheezing.   Cardiovascular:  Negative for chest pain, palpitations and leg swelling.  Gastrointestinal:  Negative for abdominal distention, abdominal pain, anal bleeding, blood in stool, constipation, diarrhea, nausea, rectal pain and vomiting.  Musculoskeletal:  Negative for arthralgias and myalgias.  Skin:  Negative for color change and pallor.  Neurological:  Negative for dizziness, syncope and weakness.  Psychiatric/Behavioral:  Negative for confusion.   All other systems reviewed and are negative.    Medications Medications Ordered Prior to Encounter[1]  Pertinent medications related to GI and procedure were reviewed by me with the patient prior to the procedure  Current Medications[2]  sodium chloride           Allergies[3] Allergies were reviewed by me prior to the procedure  Objective   Body mass index is 26.7 kg/m. Vitals:   10/25/24 0709  BP: 135/81  Pulse: 97  Resp: 16  Temp: (!) 96.7 F (35.9 C)  TempSrc: Temporal  SpO2: 97%  Weight: 66.2 kg  Height: 5' 2 (1.575 m)     Physical Exam Vitals and nursing note reviewed.  Constitutional:      General: She is not in acute distress.    Appearance: Normal appearance. She is not ill-appearing, toxic-appearing or diaphoretic.  HENT:     Head: Normocephalic and atraumatic.     Nose: Nose normal.     Mouth/Throat:     Mouth: Mucous membranes are moist.     Pharynx: Oropharynx is clear.  Eyes:     General: No scleral icterus.    Extraocular Movements: Extraocular movements intact.  Cardiovascular:     Rate and Rhythm: Normal rate and regular rhythm.     Heart sounds: Normal heart sounds. No murmur heard.    No friction rub. No gallop.  Pulmonary:     Effort: Pulmonary effort is normal. No respiratory distress.     Breath sounds: Normal breath sounds. No wheezing, rhonchi or rales.  Abdominal:     General: Bowel sounds are normal. There is no distension.     Palpations: Abdomen is soft.     Tenderness: There is no abdominal tenderness. There is no guarding or rebound.  Musculoskeletal:     Cervical back: Neck  supple.     Right lower leg: No edema.     Left lower leg: No edema.  Skin:    General: Skin is warm and dry.     Coloration: Skin is not jaundiced or pale.  Neurological:     General: No focal deficit present.     Mental Status: She is alert and oriented to person, place, and time. Mental status is at baseline.  Psychiatric:        Mood and Affect: Mood normal.        Behavior: Behavior normal.        Thought Content: Thought content normal.        Judgment: Judgment normal.      Assessment:  [name] is a 64 y.o. female  who presents today for Colonoscopy for Colorectal cancer screening  .  Plan:  Colonoscopy with possible intervention today  Colonoscopy with possible biopsy, control of bleeding, polypectomy, and interventions as necessary has been discussed with the patient/patient representative. Informed consent was obtained from the patient/patient representative after explaining the indication, nature, and risks of the procedure including but not limited to death, bleeding, perforation, missed neoplasm/lesions, cardiorespiratory compromise, and reaction to medications. Opportunity for questions was given and appropriate answers were provided. Patient/patient representative has verbalized understanding is amenable to undergoing the procedure.   Michelle Ozell Jungling, DO  [facility]  Portions of the record may have been created with voice recognition software. Occasional wrong-word or 'sound-a-like' substitutions may have occurred due to the inherent limitations of voice recognition software.  Read the chart carefully and recognize, using context, where substitutions may have occurred.     [1]  No current facility-administered medications on file prior to encounter.   Current Outpatient Medications on File Prior to Encounter  Medication Sig Dispense Refill   estradiol  (ESTRACE ) 0.5 MG tablet Take 1 tablet by mouth once daily 90 tablet 2   medroxyPROGESTERone  (PROVERA ) 2.5 MG tablet Take 1 tablet by mouth once daily 90 tablet 3   montelukast  (SINGULAIR ) 10 MG tablet Take 10 mg by mouth at bedtime.     Multiple Vitamin (MULTIVITAMINS PO) Take 1 tablet by mouth daily.     venlafaxine  (EFFEXOR ) 75 MG tablet Take 1 tablet (75 mg total) by mouth daily. 90 tablet 3   estradiol  (ESTRACE ) 0.5 MG tablet Take 1 tablet (0.5 mg total) by mouth daily. 90 tablet 1   estradiol  (ESTRACE ) 0.5 MG tablet Take 1 tablet (0.5 mg total) by mouth daily. 90 tablet 1   estradiol  (ESTRACE ) 0.5 MG tablet Take 1 tablet (0.5 mg total) by mouth daily. 90 tablet 1   fluticasone   (FLONASE ) 50 MCG/ACT nasal spray Place 2 sprays into both nostrils daily. 16 g 1   ketoconazole  (NIZORAL ) 2 % cream Apply cream topically to affected area (rash on lower abdomen) once to twice daily until clear, then as needed for flares. 60 g 11   loratadine (CLARITIN) 10 MG tablet Take 10 mg by mouth daily. (Patient not taking: Reported on 09/28/2022)     medroxyPROGESTERone  (PROVERA ) 2.5 MG tablet Take 1 tablet (2.5 mg total) by mouth daily. 90 tablet 1   montelukast  (SINGULAIR ) 10 MG tablet Take 1 tablet (10 mg total) by mouth at bedtime. 90 tablet 1   Na Sulfate-K Sulfate-Mg Sulfate concentrate (SUPREP) 17.5-3.13-1.6 GM/177ML SOLN Take 1 Bottle by mouth as directed One kit contains 2 bottles.  Take both bottles at the times instructed by your provider. 354 mL  0   Turmeric Curcumin 500 MG CAPS Take 400 mg by mouth once.     venlafaxine  XR (EFFEXOR -XR) 75 MG 24 hr capsule TAKE 1 CAPSULE BY MOUTH ONCE DAILY WITH BREAKFAST 90 capsule 3  [2]  Current Facility-Administered Medications:    0.9 %  sodium chloride  infusion, , Intravenous, Continuous, Onita Michelle Sharper, DO [3] No Known Allergies  "

## 2024-10-25 ENCOUNTER — Ambulatory Visit
Admission: RE | Admit: 2024-10-25 | Discharge: 2024-10-25 | Disposition: A | Discharge status: Home / Self Care | Attending: Gastroenterology | Admitting: Gastroenterology

## 2024-10-25 ENCOUNTER — Encounter: Payer: Self-pay | Admitting: Gastroenterology

## 2024-10-25 ENCOUNTER — Other Ambulatory Visit: Payer: Self-pay

## 2024-10-25 ENCOUNTER — Ambulatory Visit: Admitting: Anesthesiology

## 2024-10-25 ENCOUNTER — Encounter
Admission: RE | Disposition: A | Discharge status: Home / Self Care | Payer: Self-pay | Source: Home / Self Care | Attending: Gastroenterology

## 2024-10-25 DIAGNOSIS — Z1211 Encounter for screening for malignant neoplasm of colon: Secondary | ICD-10-CM | POA: Insufficient documentation

## 2024-10-25 DIAGNOSIS — D122 Benign neoplasm of ascending colon: Secondary | ICD-10-CM | POA: Insufficient documentation

## 2024-10-25 HISTORY — DX: Menopausal and female climacteric states: N95.1

## 2024-10-25 HISTORY — PX: POLYPECTOMY: SHX149

## 2024-10-25 HISTORY — PX: COLONOSCOPY: SHX5424

## 2024-10-25 SURGERY — COLONOSCOPY
Anesthesia: General

## 2024-10-25 MED ORDER — PROPOFOL 10 MG/ML IV BOLUS
INTRAVENOUS | Status: AC
  Filled 2024-10-25: qty 20

## 2024-10-25 MED ORDER — LIDOCAINE HCL (CARDIAC) PF 100 MG/5ML IV SOSY
PREFILLED_SYRINGE | INTRAVENOUS | Status: DC | PRN
  Administered 2024-10-25: 50 mg via INTRAVENOUS

## 2024-10-25 MED ORDER — LIDOCAINE HCL (PF) 2 % IJ SOLN
INTRAMUSCULAR | Status: AC
  Filled 2024-10-25: qty 5

## 2024-10-25 MED ORDER — PROPOFOL 10 MG/ML IV BOLUS
INTRAVENOUS | Status: DC | PRN
  Administered 2024-10-25: 20 mg via INTRAVENOUS
  Administered 2024-10-25: 70 mg via INTRAVENOUS

## 2024-10-25 MED ORDER — PROPOFOL 500 MG/50ML IV EMUL
INTRAVENOUS | Status: DC | PRN
  Administered 2024-10-25: 130 ug/kg/min via INTRAVENOUS

## 2024-10-25 MED ORDER — SODIUM CHLORIDE 0.9 % IV SOLN: INTRAVENOUS | Status: DC

## 2024-10-25 MED ORDER — PROPOFOL 1000 MG/100ML IV EMUL
INTRAVENOUS | Status: AC
  Filled 2024-10-25: qty 100

## 2024-10-25 MED ORDER — LIDOCAINE HCL (PF) 2 % IJ SOLN
INTRAMUSCULAR | Status: AC
  Filled 2024-10-25: qty 10

## 2024-10-25 NOTE — Op Note (Signed)
 [facility] Patient Name: Michelle Bradley Procedure Date: 10/25/2024 7:13 AM MRN: [id number] Account #: [id number] Date of Birth: [birth year] Admit Type: Outpatient Age: 64 Room: [facility] ENDO ROOM 1 Gender: Female Note Status: Finalized Instrument Name: Colon Scope [id number] Procedure:             Colonoscopy Indications:           Screening for colorectal malignant neoplasm Providers:             Elspeth Ozell Jungling DO, DO Referring MD:          Gaetana CROME. Fields (Referring MD) Medicines:             Monitored Anesthesia Care Complications:         No immediate complications. Estimated blood loss:                         Minimal. Procedure:             Pre-Anesthesia Assessment:                        - Prior to the procedure, a History and Physical was                         performed, and patient medications and allergies were                         reviewed. The patient is competent. The risks and                         benefits of the procedure and the sedation options and                         risks were discussed with the patient. All questions                         were answered and informed consent was obtained.                         Patient identification and proposed procedure were                         verified by the physician, the nurse, the anesthetist                         and the technician in the endoscopy suite. Mental                         Status Examination: alert and oriented. Airway                         Examination: normal oropharyngeal airway and neck                         mobility. Respiratory Examination: clear to                         auscultation. CV Examination: RRR, no murmurs, no S3  or S4. Prophylactic Antibiotics: The patient does not                         require prophylactic antibiotics. Prior                         Anticoagulants: The patient has taken no anticoagulant                          or antiplatelet agents. ASA Grade Assessment: II - A                         patient with mild systemic disease. After reviewing                         the risks and benefits, the patient was deemed in                         satisfactory condition to undergo the procedure. The                         anesthesia plan was to use monitored anesthesia care                         (MAC). Immediately prior to administration of                         medications, the patient was re-assessed for adequacy                         to receive sedatives. The heart rate, respiratory                         rate, oxygen saturations, blood pressure, adequacy of                         pulmonary ventilation, and response to care were                         monitored throughout the procedure. The physical                         status of the patient was re-assessed after the                         procedure.                        After obtaining informed consent, the colonoscope was                         passed under direct vision. Throughout the procedure,                         the patient's blood pressure, pulse, and oxygen                         saturations were monitored continuously. The  Colonoscope was introduced through the anus and                         advanced to the the terminal ileum, with                         identification of the appendiceal orifice and IC                         valve. The colonoscopy was performed without                         difficulty. The patient tolerated the procedure well.                         The quality of the bowel preparation was evaluated                         using the BBPS [facility] Bowel Preparation Scale) with                         scores of: Right Colon = 2 (minor amount of residual                         staining, small fragments of stool and/or opaque                         liquid, but  mucosa seen well), Transverse Colon = 3                         (entire mucosa seen well with no residual staining,                         small fragments of stool or opaque liquid) and Left                         Colon = 3 (entire mucosa seen well with no residual                         staining, small fragments of stool or opaque liquid).                         The total BBPS score equals 8. The quality of the                         bowel preparation was excellent. The terminal ileum,                         ileocecal valve, appendiceal orifice, and rectum were                         photographed. Findings:      The perianal and digital rectal examinations were normal. Pertinent       negatives include normal sphincter tone.      The terminal ileum appeared normal. Estimated blood loss: none.      Retroflexion in the right colon was performed.  Two sessile polyps were found in the ascending colon. The polyps were 3       to 4 mm in size. These polyps were removed with a cold snare. Resection       and retrieval were complete. Estimated blood loss was minimal.      Two sessile polyps were found in the ascending colon. The polyps were 1       to 2 mm in size. These polyps were removed with a jumbo cold forceps.       Resection and retrieval were complete. Estimated blood loss was minimal.      The exam was otherwise without abnormality on direct and retroflexion       views. Impression:            - The examined portion of the ileum was normal.                        - Two 3 to 4 mm polyps in the ascending colon, removed                         with a cold snare. Resected and retrieved.                        - Two 1 to 2 mm polyps in the ascending colon, removed                         with a jumbo cold forceps. Resected and retrieved.                        - The examination was otherwise normal on direct and                         retroflexion views. Recommendation:         - Patient has a contact number available for                         emergencies. The signs and symptoms of potential                         delayed complications were discussed with the patient.                         Return to normal activities tomorrow. Written                         discharge instructions were provided to the patient.                        - Discharge patient to home.                        - Resume previous diet.                        - Continue present medications.                        - Await pathology results.                        -  Repeat colonoscopy for surveillance based on                         pathology results.                        - Return to referring physician as previously                         scheduled.                        - The findings and recommendations were discussed with                         the patient. Procedure Code(s):     --- Professional ---                        [phone], Colonoscopy, flexible; with removal of                         tumor(s), polyp(s), or other lesion(s) by snare                         technique                        [id number], 59, Colonoscopy, flexible; with biopsy, single                         or multiple Diagnosis Code(s):     --- Professional ---                        Z12.11, Encounter for screening for malignant neoplasm                         of colon                        D12.2, Benign neoplasm of ascending colon CPT copyright 2022 American Medical Association. All rights reserved. The codes documented in this report are preliminary and upon coder review may  be revised to meet current compliance requirements. Attending Participation:      I personally performed the entire procedure. Elspeth Jungling, DO Elspeth Ozell Jungling DO, DO 10/25/2024 7:59:44 AM This report has been signed electronically. Number of Addenda: 0 Note Initiated On: 10/25/2024 7:13 AM Scope Withdrawal Time: 0 hours 11  minutes 35 seconds  Total Procedure Duration: 0 hours 17 minutes 36 seconds  Estimated Blood Loss:  Estimated blood loss was minimal.      [facility]

## 2024-10-25 NOTE — Transfer of Care (Signed)
 Immediate Anesthesia Transfer of Care Note  Patient: Michelle Bradley  Procedure(s) Performed: COLONOSCOPY POLYPECTOMY, INTESTINE  Patient Location: Endoscopy Unit  Anesthesia Type:General  Level of Consciousness: awake  Airway & Oxygen Therapy: Patient Spontanous Breathing  Post-op Assessment: Report given to RN and Post -op Vital signs reviewed and stable  Post vital signs: Reviewed and stable  Last Vitals:  Vitals Value Taken Time  BP 121/72 10/25/24 07:59  Temp    Pulse 86 10/25/24 08:00  Resp 22 10/25/24 08:00  SpO2 99 % 10/25/24 08:00  Vitals shown include unfiled device data.  Last Pain:  Vitals:   10/25/24 0709  TempSrc: Temporal  PainSc: 0-No pain         Complications: No notable events documented.

## 2024-10-25 NOTE — Anesthesia Preprocedure Evaluation (Signed)
"                                    Anesthesia Evaluation  Patient identified by MRN, date of birth, ID band Patient awake    Reviewed: Allergy & Precautions, NPO status , Patient's Chart, lab work & pertinent test results  Airway Mallampati: II  TM Distance: <3 FB Neck ROM: Full    Dental  (+) Teeth Intact   Pulmonary neg pulmonary ROS   Pulmonary exam normal        Cardiovascular Exercise Tolerance: Good negative cardio ROS Normal cardiovascular exam Rhythm:Regular Rate:Normal     Neuro/Psych  Headaches  Anxiety     negative neurological ROS  negative psych ROS   GI/Hepatic negative GI ROS, Neg liver ROS,,,  Endo/Other  negative endocrine ROS    Renal/GU negative Renal ROS  negative genitourinary   Musculoskeletal negative musculoskeletal ROS (+)    Abdominal Normal abdominal exam  (+)   Peds negative pediatric ROS (+)  Hematology negative hematology ROS (+)   Anesthesia Other Findings Past Medical History: No date: Anxiety No date: Hot flash, menopausal No date: Menopausal syndrome (hot flashes)  Past Surgical History: 2004: BREAST CYST ASPIRATION; Left No date: CERVIX SURGERY     Comment:  sawing up cervix internally 1985: CESAREAN SECTION No date: COLONOSCOPY; N/A No date: DILATION AND CURETTAGE OF UTERUS     Comment:  x 2 after c section  BMI    Body Mass Index: 26.70 kg/m      Reproductive/Obstetrics negative OB ROS                              Anesthesia Physical Anesthesia Plan  ASA: 2  Anesthesia Plan: General   Post-op Pain Management:    Induction: Intravenous  PONV Risk Score and Plan: Propofol  infusion and TIVA  Airway Management Planned: Natural Airway and Nasal Cannula  Additional Equipment:   Intra-op Plan:   Post-operative Plan:   Informed Consent: I have reviewed the patients History and Physical, chart, labs and discussed the procedure including the risks, benefits and  alternatives for the proposed anesthesia with the patient or authorized representative who has indicated his/her understanding and acceptance.     Dental Advisory Given  Plan Discussed with: CRNA  Anesthesia Plan Comments:         Anesthesia Quick Evaluation  "

## 2024-10-25 NOTE — Interval H&P Note (Signed)
 History and Physical Interval Note: Preprocedure H&P from 10/25/2024  was reviewed and there was no interval change after seeing and examining the patient.  Written consent was obtained from the patient after discussion of risks, benefits, and alternatives. Patient has consented to proceed with Colonoscopy with possible intervention   10/25/2024 7:23 AM  Ronal FORBES Sorrel  has presented today for surgery, with the diagnosis of Screening for colon cancer.  The various methods of treatment have been discussed with the patient and family. After consideration of risks, benefits and other options for treatment, the patient has consented to  Procedures: COLONOSCOPY (N/A) as a surgical intervention.  The patient's history has been reviewed, patient examined, no change in status, stable for surgery.  I have reviewed the patient's chart and labs.  Questions were answered to the patient's satisfaction.     Elspeth Ozell Jungling

## 2024-10-25 NOTE — Anesthesia Postprocedure Evaluation (Signed)
"   Anesthesia Post Note  Patient: Michelle Bradley  Procedure(s) Performed: COLONOSCOPY POLYPECTOMY, INTESTINE  Patient location during evaluation: PACU Anesthesia Type: General Level of consciousness: awake Pain management: satisfactory to patient Vital Signs Assessment: post-procedure vital signs reviewed and stable Respiratory status: spontaneous breathing Cardiovascular status: stable Anesthetic complications: no   No notable events documented.   Last Vitals:  Vitals:   10/25/24 0810 10/25/24 0816  BP: (!) 123/91 132/76  Pulse: 86   Resp: 18 16  Temp: 36.6 C 36.6 C  SpO2: 97%     Last Pain:  Vitals:   10/25/24 0816  TempSrc:   PainSc: 0-No pain                 VAN STAVEREN,Rucha Wissinger      "

## 2024-10-28 LAB — SURGICAL PATHOLOGY

## 2024-10-31 ENCOUNTER — Other Ambulatory Visit: Payer: Self-pay

## 2024-10-31 MED ORDER — VENLAFAXINE HCL 75 MG PO TABS
ORAL_TABLET | ORAL | 1 refills | Status: AC
  Filled 2024-10-31: qty 90, 90d supply, fill #0

## 2025-03-17 ENCOUNTER — Encounter: Admitting: Dermatology
